# Patient Record
Sex: Female | Born: 1970 | Race: White | Hispanic: No | Marital: Single | Smoking: Current every day smoker
Health system: Southern US, Community
[De-identification: ages and names within clinical notes are randomized; demographics above are authoritative.]

## PROBLEM LIST (undated history)

## (undated) DIAGNOSIS — F4310 Post-traumatic stress disorder, unspecified: Secondary | ICD-10-CM

## (undated) DIAGNOSIS — F329 Major depressive disorder, single episode, unspecified: Secondary | ICD-10-CM

## (undated) DIAGNOSIS — G43909 Migraine, unspecified, not intractable, without status migrainosus: Secondary | ICD-10-CM

## (undated) DIAGNOSIS — E119 Type 2 diabetes mellitus without complications: Secondary | ICD-10-CM

## (undated) DIAGNOSIS — Z8719 Personal history of other diseases of the digestive system: Secondary | ICD-10-CM

## (undated) DIAGNOSIS — F32A Depression, unspecified: Secondary | ICD-10-CM

## (undated) DIAGNOSIS — G629 Polyneuropathy, unspecified: Secondary | ICD-10-CM

## (undated) DIAGNOSIS — I10 Essential (primary) hypertension: Secondary | ICD-10-CM

## (undated) DIAGNOSIS — C801 Malignant (primary) neoplasm, unspecified: Secondary | ICD-10-CM

## (undated) HISTORY — PX: KNEE ARTHROSCOPY: SUR90

## (undated) HISTORY — DX: Post-traumatic stress disorder, unspecified: F43.10

## (undated) HISTORY — DX: Migraine, unspecified, not intractable, without status migrainosus: G43.909

## (undated) HISTORY — DX: Malignant (primary) neoplasm, unspecified: C80.1

## (undated) HISTORY — DX: Type 2 diabetes mellitus without complications: E11.9

## (undated) HISTORY — PX: APPENDECTOMY: SHX54

---

## 2000-10-27 ENCOUNTER — Emergency Department (HOSPITAL_COMMUNITY): Admission: EM | Admit: 2000-10-27 | Discharge: 2000-10-28 | Payer: Self-pay | Admitting: *Deleted

## 2000-10-28 ENCOUNTER — Encounter: Payer: Self-pay | Admitting: *Deleted

## 2000-10-28 ENCOUNTER — Ambulatory Visit (HOSPITAL_COMMUNITY): Admission: RE | Admit: 2000-10-28 | Discharge: 2000-10-28 | Payer: Self-pay | Admitting: *Deleted

## 2001-07-16 ENCOUNTER — Encounter: Payer: Self-pay | Admitting: *Deleted

## 2001-07-16 ENCOUNTER — Emergency Department (HOSPITAL_COMMUNITY): Admission: EM | Admit: 2001-07-16 | Discharge: 2001-07-16 | Payer: Self-pay | Admitting: *Deleted

## 2013-05-30 ENCOUNTER — Other Ambulatory Visit (HOSPITAL_COMMUNITY): Payer: Self-pay | Admitting: Physician Assistant

## 2013-05-30 DIAGNOSIS — Z139 Encounter for screening, unspecified: Secondary | ICD-10-CM

## 2013-06-13 ENCOUNTER — Ambulatory Visit (HOSPITAL_COMMUNITY)
Admission: RE | Admit: 2013-06-13 | Discharge: 2013-06-13 | Disposition: A | Discharge status: Home / Self Care | Payer: Self-pay | Source: Ambulatory Visit | Attending: Physician Assistant | Admitting: Physician Assistant

## 2013-06-13 DIAGNOSIS — Z139 Encounter for screening, unspecified: Secondary | ICD-10-CM

## 2013-06-15 ENCOUNTER — Other Ambulatory Visit: Payer: Self-pay | Admitting: Physician Assistant

## 2013-06-15 DIAGNOSIS — R928 Other abnormal and inconclusive findings on diagnostic imaging of breast: Secondary | ICD-10-CM

## 2013-07-24 ENCOUNTER — Other Ambulatory Visit (HOSPITAL_COMMUNITY): Payer: Self-pay | Admitting: Physician Assistant

## 2013-07-24 ENCOUNTER — Other Ambulatory Visit (HOSPITAL_COMMUNITY): Payer: Self-pay | Admitting: *Deleted

## 2013-07-24 DIAGNOSIS — R928 Other abnormal and inconclusive findings on diagnostic imaging of breast: Secondary | ICD-10-CM

## 2013-07-26 ENCOUNTER — Inpatient Hospital Stay (HOSPITAL_COMMUNITY): Admission: RE | Admit: 2013-07-26 | Payer: Self-pay | Source: Ambulatory Visit

## 2013-08-02 ENCOUNTER — Other Ambulatory Visit (HOSPITAL_COMMUNITY): Payer: Self-pay | Admitting: *Deleted

## 2013-08-02 ENCOUNTER — Ambulatory Visit (HOSPITAL_COMMUNITY)
Admission: RE | Admit: 2013-08-02 | Discharge: 2013-08-02 | Disposition: A | Discharge status: Home / Self Care | Payer: PRIVATE HEALTH INSURANCE | Source: Ambulatory Visit | Attending: *Deleted | Admitting: *Deleted

## 2013-08-02 DIAGNOSIS — R928 Other abnormal and inconclusive findings on diagnostic imaging of breast: Secondary | ICD-10-CM

## 2013-08-02 DIAGNOSIS — Z1231 Encounter for screening mammogram for malignant neoplasm of breast: Secondary | ICD-10-CM | POA: Insufficient documentation

## 2013-12-26 ENCOUNTER — Encounter (HOSPITAL_BASED_OUTPATIENT_CLINIC_OR_DEPARTMENT_OTHER): Payer: Self-pay | Attending: General Surgery

## 2013-12-26 ENCOUNTER — Other Ambulatory Visit: Payer: Self-pay

## 2013-12-26 DIAGNOSIS — F17200 Nicotine dependence, unspecified, uncomplicated: Secondary | ICD-10-CM | POA: Insufficient documentation

## 2013-12-26 DIAGNOSIS — L989 Disorder of the skin and subcutaneous tissue, unspecified: Secondary | ICD-10-CM | POA: Insufficient documentation

## 2013-12-26 DIAGNOSIS — Z9089 Acquired absence of other organs: Secondary | ICD-10-CM | POA: Insufficient documentation

## 2013-12-26 NOTE — H&P (Signed)
Jasmine Morrison, Jasmine Morrison               ACCOUNT NO.:  [id number]  MEDICAL RECORD NO.:  [id number]  LOCATION:  FOOT                         FACILITY:  [facility]  PHYSICIAN:  Elesa Hacker, M.D.        DATE OF BIRTH:  May 02, 1971  DATE OF ADMISSION:  12/26/2013 DATE OF DISCHARGE:                             HISTORY & PHYSICAL   CHIEF COMPLAINT:  Sore, right third toe.  HISTORY OF PRESENT ILLNESS:  This is a 43 year old female, nondiabetic, has had a growth on her toe which occasionally she self treats, but it always returns, it is tender and painful.  PAST MEDICAL HISTORY:  Reasonably negative.  She has questionably type 2 diabetes, although she said that some doctor say she does not have diabetes.  PAST SURGICAL HISTORY:  Appendectomy, C-section.  SOCIAL HISTORY:  Cigarettes smokes a pack or so a day.  Alcohol occasionally.  MEDICATIONS:  Cymbalta and trazodone.  ALLERGIES:  None.  REVIEW OF SYSTEMS:  As above.  PHYSICAL EXAMINATION:  VITAL SIGNS:  Temperature 98.4, pulse 71, respirations 18, blood pressure 113/72. GENERAL APPEARANCE:  Well developed, well nourished. CHEST:  Clear. HEART:  Regular rhythm. EXTREMITIES:  Examination of the lower extremities reveals good peripheral pulses.  There is a growth at the tip of the third toe, which has the appearance of a bony excrescence approximately 1 cm long and 0.5 cm at the base.  IMPRESSION:  Skin lesion of unknown cause.  TREATMENT:  The toe was blocked with 1% Xylocaine.  The wound was completely excised.  Hemostasis is easily obtained.  The specimen was sent to pathology.  We will see the patient in 7 days.     Elesa Hacker, M.D.     RA/MEDQ  D:  12/26/2013  T:  12/26/2013  Job:  [id number]

## 2014-01-02 ENCOUNTER — Ambulatory Visit (HOSPITAL_COMMUNITY)
Admission: RE | Admit: 2014-01-02 | Discharge: 2014-01-02 | Disposition: A | Discharge status: Home / Self Care | Payer: Self-pay | Source: Ambulatory Visit | Attending: General Surgery | Admitting: General Surgery

## 2014-01-02 ENCOUNTER — Other Ambulatory Visit (HOSPITAL_COMMUNITY): Payer: Self-pay | Admitting: General Surgery

## 2014-01-02 DIAGNOSIS — M869 Osteomyelitis, unspecified: Secondary | ICD-10-CM

## 2014-01-02 DIAGNOSIS — M899 Disorder of bone, unspecified: Secondary | ICD-10-CM | POA: Insufficient documentation

## 2014-01-02 DIAGNOSIS — S91109A Unspecified open wound of unspecified toe(s) without damage to nail, initial encounter: Secondary | ICD-10-CM | POA: Insufficient documentation

## 2014-01-02 DIAGNOSIS — R52 Pain, unspecified: Secondary | ICD-10-CM

## 2014-01-02 DIAGNOSIS — X58XXXA Exposure to other specified factors, initial encounter: Secondary | ICD-10-CM | POA: Insufficient documentation

## 2014-01-02 DIAGNOSIS — M949 Disorder of cartilage, unspecified: Secondary | ICD-10-CM

## 2014-01-08 ENCOUNTER — Other Ambulatory Visit (HOSPITAL_COMMUNITY): Payer: Self-pay

## 2014-01-08 ENCOUNTER — Ambulatory Visit (HOSPITAL_COMMUNITY)
Admission: RE | Admit: 2014-01-08 | Discharge: 2014-01-08 | Disposition: A | Discharge status: Home / Self Care | Payer: Self-pay | Source: Ambulatory Visit | Attending: General Surgery | Admitting: General Surgery

## 2014-01-08 DIAGNOSIS — M6250 Muscle wasting and atrophy, not elsewhere classified, unspecified site: Secondary | ICD-10-CM | POA: Insufficient documentation

## 2014-01-08 DIAGNOSIS — M869 Osteomyelitis, unspecified: Secondary | ICD-10-CM | POA: Insufficient documentation

## 2014-01-08 DIAGNOSIS — M79609 Pain in unspecified limb: Secondary | ICD-10-CM | POA: Insufficient documentation

## 2014-01-08 DIAGNOSIS — R52 Pain, unspecified: Secondary | ICD-10-CM

## 2014-01-08 LAB — POCT I-STAT CREATININE: Creatinine, Ser: 0.9 mg/dL (ref 0.50–1.10)

## 2014-01-08 MED ORDER — GADOBENATE DIMEGLUMINE 529 MG/ML IV SOLN
20.0000 mL | Freq: Once | INTRAVENOUS | Status: AC | PRN
  Administered 2014-01-08: 10 mL via INTRAVENOUS

## 2014-01-11 ENCOUNTER — Other Ambulatory Visit (HOSPITAL_COMMUNITY): Payer: Self-pay | Admitting: *Deleted

## 2014-01-11 DIAGNOSIS — N6002 Solitary cyst of left breast: Secondary | ICD-10-CM

## 2014-01-16 ENCOUNTER — Encounter (HOSPITAL_BASED_OUTPATIENT_CLINIC_OR_DEPARTMENT_OTHER): Payer: Self-pay | Attending: General Surgery

## 2014-01-16 DIAGNOSIS — L851 Acquired keratosis [keratoderma] palmaris et plantaris: Secondary | ICD-10-CM | POA: Insufficient documentation

## 2014-01-16 DIAGNOSIS — L84 Corns and callosities: Secondary | ICD-10-CM | POA: Insufficient documentation

## 2014-02-06 ENCOUNTER — Ambulatory Visit (HOSPITAL_COMMUNITY)
Admission: RE | Admit: 2014-02-06 | Discharge: 2014-02-06 | Disposition: A | Discharge status: Home / Self Care | Payer: PRIVATE HEALTH INSURANCE | Source: Ambulatory Visit | Attending: *Deleted | Admitting: *Deleted

## 2014-02-06 DIAGNOSIS — N6002 Solitary cyst of left breast: Secondary | ICD-10-CM

## 2014-02-06 DIAGNOSIS — Z09 Encounter for follow-up examination after completed treatment for conditions other than malignant neoplasm: Secondary | ICD-10-CM | POA: Insufficient documentation

## 2014-02-06 DIAGNOSIS — N6009 Solitary cyst of unspecified breast: Secondary | ICD-10-CM | POA: Insufficient documentation

## 2014-02-14 ENCOUNTER — Encounter (HOSPITAL_BASED_OUTPATIENT_CLINIC_OR_DEPARTMENT_OTHER): Payer: PRIVATE HEALTH INSURANCE | Attending: General Surgery

## 2014-02-14 DIAGNOSIS — L851 Acquired keratosis [keratoderma] palmaris et plantaris: Secondary | ICD-10-CM | POA: Insufficient documentation

## 2014-06-13 ENCOUNTER — Other Ambulatory Visit (HOSPITAL_COMMUNITY): Payer: Self-pay | Admitting: Physician Assistant

## 2014-06-13 DIAGNOSIS — R51 Headache: Principal | ICD-10-CM

## 2014-06-13 DIAGNOSIS — R519 Headache, unspecified: Secondary | ICD-10-CM

## 2014-06-18 ENCOUNTER — Ambulatory Visit (HOSPITAL_COMMUNITY)
Admission: RE | Admit: 2014-06-18 | Discharge: 2014-06-18 | Disposition: A | Discharge status: Home / Self Care | Payer: Self-pay | Source: Ambulatory Visit | Attending: Physician Assistant | Admitting: Physician Assistant

## 2014-06-18 DIAGNOSIS — R51 Headache: Secondary | ICD-10-CM | POA: Insufficient documentation

## 2014-06-18 DIAGNOSIS — R519 Headache, unspecified: Secondary | ICD-10-CM

## 2014-09-11 ENCOUNTER — Other Ambulatory Visit (HOSPITAL_COMMUNITY): Payer: Self-pay | Admitting: *Deleted

## 2014-09-11 DIAGNOSIS — Z09 Encounter for follow-up examination after completed treatment for conditions other than malignant neoplasm: Secondary | ICD-10-CM

## 2014-09-11 DIAGNOSIS — C801 Malignant (primary) neoplasm, unspecified: Secondary | ICD-10-CM

## 2014-09-11 HISTORY — DX: Malignant (primary) neoplasm, unspecified: C80.1

## 2014-09-18 ENCOUNTER — Other Ambulatory Visit (HOSPITAL_COMMUNITY): Payer: Self-pay | Admitting: *Deleted

## 2014-09-18 ENCOUNTER — Ambulatory Visit (HOSPITAL_COMMUNITY)
Admission: RE | Admit: 2014-09-18 | Discharge: 2014-09-18 | Disposition: A | Discharge status: Home / Self Care | Payer: PRIVATE HEALTH INSURANCE | Source: Ambulatory Visit | Attending: *Deleted | Admitting: *Deleted

## 2014-09-18 DIAGNOSIS — N6011 Diffuse cystic mastopathy of right breast: Secondary | ICD-10-CM | POA: Diagnosis not present

## 2014-09-18 DIAGNOSIS — Z09 Encounter for follow-up examination after completed treatment for conditions other than malignant neoplasm: Secondary | ICD-10-CM

## 2014-09-18 DIAGNOSIS — N6012 Diffuse cystic mastopathy of left breast: Secondary | ICD-10-CM | POA: Insufficient documentation

## 2014-09-26 ENCOUNTER — Other Ambulatory Visit: Payer: Self-pay | Admitting: Obstetrics and Gynecology

## 2014-09-26 ENCOUNTER — Ambulatory Visit (INDEPENDENT_AMBULATORY_CARE_PROVIDER_SITE_OTHER): Payer: Medicaid Other | Admitting: Obstetrics and Gynecology

## 2014-09-26 ENCOUNTER — Encounter: Payer: Self-pay | Admitting: Obstetrics and Gynecology

## 2014-09-26 VITALS — BP 110/60 | Ht 63.0 in | Wt 245.0 lb

## 2014-09-26 DIAGNOSIS — N871 Moderate cervical dysplasia: Secondary | ICD-10-CM

## 2014-09-26 DIAGNOSIS — R87618 Other abnormal cytological findings on specimens from cervix uteri: Secondary | ICD-10-CM

## 2014-09-26 HISTORY — PX: COLPOSCOPY: SHX161

## 2014-09-26 NOTE — Patient Instructions (Signed)
RESULTS OF BIOPSY WILL BE BACK IN 1 WK Colposcopy Care After Colposcopy is a procedure in which a special tool is used to magnify the surface of the cervix. A tissue sample (biopsy) may also be taken. This sample will be looked at for cervical cancer or other problems. After the test:  You may have some cramping.  Lie down for a few minutes if you feel lightheaded.   You may have some bleeding which should stop in a few days. HOME CARE  Do not have sex or use tampons for 2 to 3 days or as told.  Only take medicine as told by your doctor.  Continue to take your birth control pills as usual. Finding out the results of your test Ask when your test results will be ready. Make sure you get your test results. GET HELP RIGHT AWAY IF:  You are bleeding a lot or are passing blood clots.  You develop a fever of 102 F (38.9 C) or higher.  You have abnormal vaginal discharge.  You have cramps that do not go away with medicine.  You feel lightheaded, dizzy, or pass out (faint). MAKE SURE YOU:   Understand these instructions.  Will watch your condition.  Will get help right away if you are not doing well or get worse. Document Released: 12/16/2007 Document Revised: 09/21/2011 Document Reviewed: 01/26/2013 [facility] Patient Information 2015 [place], [place]. This information is not intended to replace advice given to you by your health care provider. Make sure you discuss any questions you have with your health care provider.
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
## 2014-09-26 NOTE — Progress Notes (Signed)
Patient ID: Jasmine Morrison, female   DOB: 19-Jun-1971, 44 y.o.   MRN: [id number] Pt here today for referral from [facility]. Pt states that she had not had a pap smear since 1993, and has never had, to her knowledge, an abnormal pap smear. Pt states that she started having problems with her breasts, while she was there they talked her into doing a pap smear since it had been so long since her last one. The pt stated that the next week they called her back and made a follow up appointment where they told her that she has cancer. 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 Pt would like to discuss all of this with Dr. Glo Herring and then do what ever needs to be done.

## 2014-09-29 ENCOUNTER — Emergency Department (HOSPITAL_COMMUNITY)
Admission: EM | Admit: 2014-09-29 | Discharge: 2014-09-29 | Disposition: A | Discharge status: Home / Self Care | Payer: Medicaid Other | Attending: Emergency Medicine | Admitting: Emergency Medicine

## 2014-09-29 ENCOUNTER — Encounter (HOSPITAL_COMMUNITY): Payer: Self-pay | Admitting: Emergency Medicine

## 2014-09-29 DIAGNOSIS — Z3202 Encounter for pregnancy test, result negative: Secondary | ICD-10-CM | POA: Diagnosis not present

## 2014-09-29 DIAGNOSIS — R1084 Generalized abdominal pain: Secondary | ICD-10-CM | POA: Diagnosis present

## 2014-09-29 DIAGNOSIS — E119 Type 2 diabetes mellitus without complications: Secondary | ICD-10-CM | POA: Diagnosis not present

## 2014-09-29 DIAGNOSIS — Z8659 Personal history of other mental and behavioral disorders: Secondary | ICD-10-CM | POA: Insufficient documentation

## 2014-09-29 DIAGNOSIS — Z720 Tobacco use: Secondary | ICD-10-CM | POA: Diagnosis not present

## 2014-09-29 DIAGNOSIS — Z9889 Other specified postprocedural states: Secondary | ICD-10-CM

## 2014-09-29 DIAGNOSIS — N939 Abnormal uterine and vaginal bleeding, unspecified: Secondary | ICD-10-CM | POA: Diagnosis not present

## 2014-09-29 DIAGNOSIS — Z791 Long term (current) use of non-steroidal anti-inflammatories (NSAID): Secondary | ICD-10-CM | POA: Diagnosis not present

## 2014-09-29 DIAGNOSIS — Z8679 Personal history of other diseases of the circulatory system: Secondary | ICD-10-CM | POA: Insufficient documentation

## 2014-09-29 LAB — URINALYSIS, ROUTINE W REFLEX MICROSCOPIC
BILIRUBIN URINE: NEGATIVE
Glucose, UA: NEGATIVE mg/dL
KETONES UR: NEGATIVE mg/dL
Leukocytes, UA: NEGATIVE
Nitrite: NEGATIVE
PH: 6 (ref 5.0–8.0)
Protein, ur: NEGATIVE mg/dL
Specific Gravity, Urine: 1.01 (ref 1.005–1.030)
UROBILINOGEN UA: 0.2 mg/dL (ref 0.0–1.0)

## 2014-09-29 LAB — URINE MICROSCOPIC-ADD ON

## 2014-09-29 LAB — I-STAT CHEM 8, ED
BUN: 7 mg/dL (ref 6–23)
CALCIUM ION: 1.18 mmol/L (ref 1.12–1.23)
CHLORIDE: 104 mmol/L (ref 96–112)
CREATININE: 0.7 mg/dL (ref 0.50–1.10)
Glucose, Bld: 119 mg/dL — ABNORMAL HIGH (ref 70–99)
HCT: 47 % — ABNORMAL HIGH (ref 36.0–46.0)
Hemoglobin: 16 g/dL — ABNORMAL HIGH (ref 12.0–15.0)
POTASSIUM: 4.4 mmol/L (ref 3.5–5.1)
Sodium: 140 mmol/L (ref 135–145)
TCO2: 22 mmol/L (ref 0–100)

## 2014-09-29 LAB — PREGNANCY, URINE: PREG TEST UR: NEGATIVE

## 2014-09-29 MED ORDER — NAPROXEN SODIUM 550 MG PO TABS: 550.0000 mg | ORAL_TABLET | Freq: Two times a day (BID) | ORAL | Status: DC

## 2014-09-29 NOTE — Discharge Instructions (Signed)
Your bleeding today is normal after the procedure that you had. Stop the aleve and take the prescription medication we give you today. Call Dr. Glo Herring for a follow up appointment.

## 2014-09-29 NOTE — ED Notes (Signed)
Pelvic cart set up 

## 2014-09-29 NOTE — ED Notes (Signed)
Colposcopy done this past Wednesday, has had cramping ever since.
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
## 2014-10-01 ENCOUNTER — Telehealth: Payer: Self-pay | Admitting: Obstetrics and Gynecology

## 2014-10-01 NOTE — Telephone Encounter (Signed)
Pt aware of path result. Will see for appt Wed 3/23 for deciding on further surgery, CKC recommended unless other problems exist.

## 2014-10-03 ENCOUNTER — Ambulatory Visit (INDEPENDENT_AMBULATORY_CARE_PROVIDER_SITE_OTHER): Payer: Medicaid Other | Admitting: Obstetrics and Gynecology

## 2014-10-03 ENCOUNTER — Encounter: Payer: Self-pay | Admitting: Obstetrics and Gynecology

## 2014-10-03 VITALS — BP 100/60 | Ht 63.0 in | Wt 244.0 lb

## 2014-10-03 DIAGNOSIS — R87613 High grade squamous intraepithelial lesion on cytologic smear of cervix (HGSIL): Secondary | ICD-10-CM | POA: Diagnosis not present

## 2014-10-03 DIAGNOSIS — N871 Moderate cervical dysplasia: Secondary | ICD-10-CM | POA: Diagnosis not present

## 2014-10-03 NOTE — Progress Notes (Signed)
Patient ID: Jasmine Morrison, female   DOB: [birth year], 44 y.o.   MRN: [id number] Pt here today for results.
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
## 2014-10-25 NOTE — Patient Instructions (Signed)
Jasmine Morrison  10/25/2014   Your procedure is scheduled on:  10/30/2014  Report to [facility] at 6:15 AM.  Call this number if you have problems the morning of surgery: [phone]   Remember:   Do not eat food or drink liquids after midnight.   Take these medicines the morning of surgery with A SIP OF WATER: Buspar, Cymbalta, Gabapentin, Lisinopril, Inderal   Do not wear jewelry, make-up or nail polish.  Do not wear lotions, powders, or perfumes. You may wear deodorant.  Do not shave 48 hours prior to surgery. Men may shave face and neck.  Do not bring valuables to the hospital.  [facility] is not responsible for any belongings or valuables.               Contacts, dentures or bridgework may not be worn into surgery.  Leave suitcase in the car. After surgery it may be brought to your room.  For patients admitted to the hospital, discharge time is determined by your                treatment team.               Patients discharged the day of surgery will not be allowed to drive home.  Name and phone number of your driver:   Special Instructions: Shower using CHG 2 nights before surgery and the night before surgery.  If you shower the day of surgery use CHG.  Use special wash - you have one bottle of CHG for all showers.  You should use approximately 1/3 of the bottle for each shower.   Please read over the following fact sheets that you were given: Surgical Site Infection Prevention and Anesthesia Post-op Instructions   PATIENT INSTRUCTIONS POST-ANESTHESIA  IMMEDIATELY FOLLOWING SURGERY:  Do not drive or operate machinery for the first twenty four hours after surgery.  Do not make any important decisions for twenty four hours after surgery or while taking narcotic pain medications or sedatives.  If you develop intractable nausea and vomiting or a severe headache please notify your doctor immediately.  FOLLOW-UP:  Please make an appointment with your surgeon as instructed. You do not  need to follow up with anesthesia unless specifically instructed to do so.  WOUND CARE INSTRUCTIONS (if applicable):  Keep a dry clean dressing on the anesthesia/puncture wound site if there is drainage.  Once the wound has quit draining you may leave it open to air.  Generally you should leave the bandage intact for twenty four hours unless there is drainage.  If the epidural site drains for more than 36-48 hours please call the anesthesia department.  QUESTIONS?:  Please feel free to call your physician or the hospital operator if you have any questions, and they will be happy to assist you.      Conization of the Cervix Cervical conization is the cutting (excision) of a cone-shaped portion of the cervix. The procedure is performed through the vagina in either your health care provider's office or an operating room. This procedure is usually done when there is abnormal bleeding from the cervix. It can also be done to evaluate an abnormal Pap test or if an abnormality is seen on the cervix during an exam. The tissue is then examined to see if there are precancerous cells or cancer present.  Conization of the cervix is not done during a menstrual period or pregnancy.  LET [facility] CARE PROVIDER KNOW ABOUT:  Any  allergies you have.   All medicines you are taking, including vitamins, herbs, eye drops, creams, and over-the-counter medicines.   Previous problems you or members of your family have had with the use of anesthetics.   Any blood disorders you have.   Previous surgeries you have had.   Medical conditions you have.   Your smoking habits.   The possibility of being pregnant.  RISKS AND COMPLICATIONS  Generally, conization of the cervix is a safe procedure. However, as with any procedure, complications can occur. Possible complications include:  Heavy bleeding several days or weeks after the procedure. Light bleeding or spotting after the procedure is normal.  Infection  (rare).  Damage to the cervix or surrounding organs (uncommon).   Problems with the anesthesia.   Increased risk of preterm labor in future pregnancies. BEFORE THE PROCEDURE  Do not eat or drink anything for 6-8 hours before the procedure.   Do not take aspirin or blood thinners for at least a week before the procedure or as directed by your health care provider.   Arrange for someone to take you home after the procedure.  PROCEDURE There are three different methods to perform conization of the cervix. These include:   The cold knife method. In this method a small cone-shaped sample of tissue is cut out with a knife (scalpel) from the cervical canal and the transformation zone (where the normal cells end and the abnormal cells begin).   The LEEP method. In this method a small cone-shaped sample of tissue is cut out with a thin wire that can burn (cauterize) the cervical tissue with an electrical current.   Laser treatment. In this method a small cone-shaped sample of tissue is cut out and then cauterized with a laser beam to prevent bleeding.  The procedure will be performed as follows:   Depending on the method, you will either be given a medicine to make you sleep (general anesthetic) or a numbing medicine (local anesthetic). A medicine that numbs the cervix (cervical block) may be given.   A lubricated device called a speculum will be inserted into the vagina to spread open the walls of the vagina. This will help your health care provider see the inside of the vagina and cervix better.   The tissue from the cervix will be removed and examined.   The results of the procedure will help your health care provider decide if further treatment is necessary. They will also help your health care provider decide on the best treatment if your results are abnormal. AFTER THE PROCEDURE  If you had a general anesthetic, you may be groggy for 2-3 hours after the procedure.   If you  had a local anesthetic, you will rest at the clinic or hospital until you are stable and feel ready to go home.   Recovery may take up to 3 weeks.   You may have some cramping for about 1 week.   You may have bloody discharge or light bleeding for 1-2 weeks.   You may have black discharge coming from the vagina. This is from the paste used on the cervix to prevent bleeding. This is normal discharge.  Document Released: 04/08/2005 Document Revised: 07/04/2013 Document Reviewed: 12/23/2012 [facility] Patient Information 2015 [place], [place]. This information is not intended to replace advice given to you by your health care provider. Make sure you discuss any questions you have with your health care provider.

## 2014-10-26 ENCOUNTER — Other Ambulatory Visit: Payer: Self-pay

## 2014-10-26 ENCOUNTER — Encounter (HOSPITAL_COMMUNITY)
Admission: RE | Admit: 2014-10-26 | Discharge: 2014-10-26 | Disposition: A | Discharge status: Home / Self Care | Payer: Medicaid Other | Source: Ambulatory Visit | Attending: Obstetrics and Gynecology | Admitting: Obstetrics and Gynecology

## 2014-10-26 ENCOUNTER — Encounter (HOSPITAL_COMMUNITY): Payer: Self-pay

## 2014-10-26 ENCOUNTER — Other Ambulatory Visit: Payer: Self-pay | Admitting: Obstetrics and Gynecology

## 2014-10-26 DIAGNOSIS — N871 Moderate cervical dysplasia: Secondary | ICD-10-CM | POA: Diagnosis not present

## 2014-10-26 DIAGNOSIS — Z01810 Encounter for preprocedural cardiovascular examination: Secondary | ICD-10-CM | POA: Diagnosis not present

## 2014-10-26 DIAGNOSIS — Z01812 Encounter for preprocedural laboratory examination: Secondary | ICD-10-CM | POA: Diagnosis not present

## 2014-10-26 HISTORY — DX: Major depressive disorder, single episode, unspecified: F32.9

## 2014-10-26 HISTORY — DX: Depression, unspecified: F32.A

## 2014-10-26 HISTORY — DX: Polyneuropathy, unspecified: G62.9

## 2014-10-26 LAB — COMPREHENSIVE METABOLIC PANEL
ALK PHOS: 68 U/L (ref 39–117)
ALT: 18 U/L (ref 0–35)
AST: 19 U/L (ref 0–37)
Albumin: 4 g/dL (ref 3.5–5.2)
Anion gap: 8 (ref 5–15)
BILIRUBIN TOTAL: 0.5 mg/dL (ref 0.3–1.2)
BUN: 15 mg/dL (ref 6–23)
CHLORIDE: 103 mmol/L (ref 96–112)
CO2: 25 mmol/L (ref 19–32)
Calcium: 9.4 mg/dL (ref 8.4–10.5)
Creatinine, Ser: 0.77 mg/dL (ref 0.50–1.10)
GLUCOSE: 111 mg/dL — AB (ref 70–99)
POTASSIUM: 4.4 mmol/L (ref 3.5–5.1)
SODIUM: 136 mmol/L (ref 135–145)
Total Protein: 6.9 g/dL (ref 6.0–8.3)

## 2014-10-26 LAB — URINALYSIS, ROUTINE W REFLEX MICROSCOPIC
BILIRUBIN URINE: NEGATIVE
GLUCOSE, UA: NEGATIVE mg/dL
KETONES UR: NEGATIVE mg/dL
LEUKOCYTES UA: NEGATIVE
NITRITE: NEGATIVE
Protein, ur: NEGATIVE mg/dL
SPECIFIC GRAVITY, URINE: 1.015 (ref 1.005–1.030)
Urobilinogen, UA: 0.2 mg/dL (ref 0.0–1.0)
pH: 5.5 (ref 5.0–8.0)

## 2014-10-26 LAB — CBC
HEMATOCRIT: 42.8 % (ref 36.0–46.0)
Hemoglobin: 14.6 g/dL (ref 12.0–15.0)
MCH: 30.7 pg (ref 26.0–34.0)
MCHC: 34.1 g/dL (ref 30.0–36.0)
MCV: 89.9 fL (ref 78.0–100.0)
PLATELETS: 185 10*3/uL (ref 150–400)
RBC: 4.76 MIL/uL (ref 3.87–5.11)
RDW: 13.4 % (ref 11.5–15.5)
WBC: 11.7 10*3/uL — AB (ref 4.0–10.5)

## 2014-10-26 LAB — URINE MICROSCOPIC-ADD ON

## 2014-10-26 LAB — HCG, SERUM, QUALITATIVE: PREG SERUM: NEGATIVE

## 2014-10-26 NOTE — Pre-Procedure Instructions (Signed)
Patient given information to sign up for my chart at home. 

## 2014-10-30 ENCOUNTER — Encounter (HOSPITAL_COMMUNITY)
Admission: RE | Disposition: A | Discharge status: Home / Self Care | Payer: Self-pay | Source: Ambulatory Visit | Attending: Obstetrics and Gynecology

## 2014-10-30 ENCOUNTER — Encounter (HOSPITAL_COMMUNITY): Payer: Self-pay

## 2014-10-30 ENCOUNTER — Ambulatory Visit (HOSPITAL_COMMUNITY)
Admission: RE | Admit: 2014-10-30 | Discharge: 2014-10-30 | Disposition: A | Discharge status: Home / Self Care | Payer: Medicaid Other | Source: Ambulatory Visit | Attending: Obstetrics and Gynecology | Admitting: Obstetrics and Gynecology

## 2014-10-30 ENCOUNTER — Ambulatory Visit (HOSPITAL_COMMUNITY): Payer: Medicaid Other | Admitting: Anesthesiology

## 2014-10-30 DIAGNOSIS — Z791 Long term (current) use of non-steroidal anti-inflammatories (NSAID): Secondary | ICD-10-CM | POA: Insufficient documentation

## 2014-10-30 DIAGNOSIS — Z859 Personal history of malignant neoplasm, unspecified: Secondary | ICD-10-CM | POA: Diagnosis not present

## 2014-10-30 DIAGNOSIS — G43909 Migraine, unspecified, not intractable, without status migrainosus: Secondary | ICD-10-CM | POA: Insufficient documentation

## 2014-10-30 DIAGNOSIS — Z6841 Body Mass Index (BMI) 40.0 and over, adult: Secondary | ICD-10-CM | POA: Insufficient documentation

## 2014-10-30 DIAGNOSIS — D069 Carcinoma in situ of cervix, unspecified: Secondary | ICD-10-CM | POA: Diagnosis not present

## 2014-10-30 DIAGNOSIS — E119 Type 2 diabetes mellitus without complications: Secondary | ICD-10-CM | POA: Insufficient documentation

## 2014-10-30 DIAGNOSIS — N871 Moderate cervical dysplasia: Secondary | ICD-10-CM | POA: Diagnosis present

## 2014-10-30 DIAGNOSIS — F4310 Post-traumatic stress disorder, unspecified: Secondary | ICD-10-CM | POA: Diagnosis not present

## 2014-10-30 DIAGNOSIS — F17210 Nicotine dependence, cigarettes, uncomplicated: Secondary | ICD-10-CM | POA: Insufficient documentation

## 2014-10-30 DIAGNOSIS — Z79899 Other long term (current) drug therapy: Secondary | ICD-10-CM | POA: Diagnosis not present

## 2014-10-30 DIAGNOSIS — F329 Major depressive disorder, single episode, unspecified: Secondary | ICD-10-CM | POA: Insufficient documentation

## 2014-10-30 DIAGNOSIS — K219 Gastro-esophageal reflux disease without esophagitis: Secondary | ICD-10-CM | POA: Diagnosis not present

## 2014-10-30 DIAGNOSIS — N870 Mild cervical dysplasia: Secondary | ICD-10-CM | POA: Diagnosis not present

## 2014-10-30 HISTORY — PX: CERVICAL CONIZATION W/BX: SHX1330

## 2014-10-30 LAB — GLUCOSE, CAPILLARY
GLUCOSE-CAPILLARY: 124 mg/dL — AB (ref 70–99)
Glucose-Capillary: 143 mg/dL — ABNORMAL HIGH (ref 70–99)

## 2014-10-30 SURGERY — CONE BIOPSY, CERVIX
Anesthesia: General | Site: Cervix

## 2014-10-30 MED ORDER — IBUPROFEN 600 MG PO TABS: 600.0000 mg | ORAL_TABLET | Freq: Four times a day (QID) | ORAL | Status: DC | PRN

## 2014-10-30 MED ORDER — HEMOSTATIC AGENTS (NO CHARGE) OPTIME
TOPICAL | Status: DC | PRN
  Administered 2014-10-30: 1 via TOPICAL

## 2014-10-30 MED ORDER — BUPIVACAINE-EPINEPHRINE 0.5% -1:200000 IJ SOLN
INTRAMUSCULAR | Status: DC | PRN
  Administered 2014-10-30: 5 mL

## 2014-10-30 MED ORDER — PROPOFOL 10 MG/ML IV BOLUS
INTRAVENOUS | Status: DC | PRN
  Administered 2014-10-30: 180 mg via INTRAVENOUS

## 2014-10-30 MED ORDER — ALBUTEROL SULFATE HFA 108 (90 BASE) MCG/ACT IN AERS
INHALATION_SPRAY | RESPIRATORY_TRACT | Status: DC | PRN
  Administered 2014-10-30 (×2): 3 via RESPIRATORY_TRACT

## 2014-10-30 MED ORDER — PROPOFOL 10 MG/ML IV BOLUS
INTRAVENOUS | Status: AC
  Filled 2014-10-30: qty 20

## 2014-10-30 MED ORDER — NEOSTIGMINE METHYLSULFATE 10 MG/10ML IV SOLN
INTRAVENOUS | Status: AC
  Filled 2014-10-30: qty 1

## 2014-10-30 MED ORDER — ROCURONIUM BROMIDE 100 MG/10ML IV SOLN
INTRAVENOUS | Status: DC | PRN
  Administered 2014-10-30: 5 mg via INTRAVENOUS
  Administered 2014-10-30: 35 mg via INTRAVENOUS

## 2014-10-30 MED ORDER — BUPIVACAINE-EPINEPHRINE (PF) 0.5% -1:200000 IJ SOLN
INTRAMUSCULAR | Status: AC
  Filled 2014-10-30: qty 30

## 2014-10-30 MED ORDER — ALBUTEROL SULFATE HFA 108 (90 BASE) MCG/ACT IN AERS
INHALATION_SPRAY | RESPIRATORY_TRACT | Status: AC
  Filled 2014-10-30: qty 6.7

## 2014-10-30 MED ORDER — FENTANYL CITRATE (PF) 100 MCG/2ML IJ SOLN
INTRAMUSCULAR | Status: DC | PRN
  Administered 2014-10-30: 100 ug via INTRAVENOUS

## 2014-10-30 MED ORDER — ONDANSETRON HCL 4 MG/2ML IJ SOLN
4.0000 mg | Freq: Once | INTRAMUSCULAR | Status: AC
  Administered 2014-10-30: 4 mg via INTRAVENOUS

## 2014-10-30 MED ORDER — LIDOCAINE HCL (CARDIAC) 10 MG/ML IV SOLN
INTRAVENOUS | Status: DC | PRN
  Administered 2014-10-30: 50 mg via INTRAVENOUS

## 2014-10-30 MED ORDER — MEPERIDINE HCL 50 MG/ML IJ SOLN: 6.2500 mg | Freq: Once | INTRAMUSCULAR | Status: DC

## 2014-10-30 MED ORDER — SUCCINYLCHOLINE CHLORIDE 20 MG/ML IJ SOLN
INTRAMUSCULAR | Status: AC
  Filled 2014-10-30: qty 1

## 2014-10-30 MED ORDER — MIDAZOLAM HCL 2 MG/2ML IJ SOLN
INTRAMUSCULAR | Status: AC
  Filled 2014-10-30: qty 2

## 2014-10-30 MED ORDER — FENTANYL CITRATE (PF) 100 MCG/2ML IJ SOLN: 25.0000 ug | INTRAMUSCULAR | Status: DC | PRN

## 2014-10-30 MED ORDER — LIDOCAINE HCL (PF) 1 % IJ SOLN
INTRAMUSCULAR | Status: AC
  Filled 2014-10-30: qty 5

## 2014-10-30 MED ORDER — HYDROCODONE-ACETAMINOPHEN 5-325 MG PO TABS: 1.0000 | ORAL_TABLET | Freq: Four times a day (QID) | ORAL | Status: DC | PRN

## 2014-10-30 MED ORDER — ONDANSETRON HCL 4 MG/2ML IJ SOLN: 4.0000 mg | Freq: Once | INTRAMUSCULAR | Status: DC | PRN

## 2014-10-30 MED ORDER — FERRIC SUBSULFATE 259 MG/GM EX SOLN
CUTANEOUS | Status: DC | PRN
  Administered 2014-10-30: 1 via TOPICAL

## 2014-10-30 MED ORDER — ROCURONIUM BROMIDE 50 MG/5ML IV SOLN
INTRAVENOUS | Status: AC
  Filled 2014-10-30: qty 1

## 2014-10-30 MED ORDER — ACETIC ACID 5 % SOLN
Status: DC | PRN
  Administered 2014-10-30: 1 via TOPICAL

## 2014-10-30 MED ORDER — LACTATED RINGERS IV SOLN
INTRAVENOUS | Status: DC
  Administered 2014-10-30: 07:00:00 via INTRAVENOUS

## 2014-10-30 MED ORDER — GLYCOPYRROLATE 0.2 MG/ML IJ SOLN
INTRAMUSCULAR | Status: AC
  Filled 2014-10-30: qty 4

## 2014-10-30 MED ORDER — FENTANYL CITRATE (PF) 100 MCG/2ML IJ SOLN
INTRAMUSCULAR | Status: AC
  Filled 2014-10-30: qty 2

## 2014-10-30 MED ORDER — NEOSTIGMINE METHYLSULFATE 10 MG/10ML IV SOLN
INTRAVENOUS | Status: DC | PRN
  Administered 2014-10-30: 4 mg via INTRAVENOUS

## 2014-10-30 MED ORDER — MIDAZOLAM HCL 2 MG/2ML IJ SOLN
1.0000 mg | INTRAMUSCULAR | Status: DC | PRN
  Administered 2014-10-30: 2 mg via INTRAVENOUS

## 2014-10-30 MED ORDER — FERRIC SUBSULFATE 259 MG/GM EX SOLN
CUTANEOUS | Status: AC
  Filled 2014-10-30: qty 8

## 2014-10-30 MED ORDER — GLYCOPYRROLATE 0.2 MG/ML IJ SOLN
INTRAMUSCULAR | Status: DC | PRN
Start: 2014-10-30 — End: 2014-10-30
  Administered 2014-10-30: .8 mg via INTRAVENOUS

## 2014-10-30 MED ORDER — ONDANSETRON HCL 4 MG/2ML IJ SOLN
INTRAMUSCULAR | Status: AC
  Filled 2014-10-30: qty 2

## 2014-10-30 SURGICAL SUPPLY — 24 items
BAG HAMPER (MISCELLANEOUS) ×3 IMPLANT
BLADE SURG 15 STRL LF DISP TIS (BLADE) ×1 IMPLANT
BLADE SURG 15 STRL SS (BLADE) ×3
CLOTH BEACON ORANGE TIMEOUT ST (SAFETY) ×3 IMPLANT
COVER LIGHT HANDLE STERIS (MISCELLANEOUS) ×6 IMPLANT
DRAPE PROXIMA HALF (DRAPES) ×3 IMPLANT
ELECT BLADE 6 FLAT ULTRCLN (ELECTRODE) ×3 IMPLANT
ELECT REM PT RETURN 9FT ADLT (ELECTROSURGICAL) ×3
ELECTRODE REM PT RTRN 9FT ADLT (ELECTROSURGICAL) ×1 IMPLANT
FORMALIN 10 PREFIL 120ML (MISCELLANEOUS) ×3 IMPLANT
GLOVE BIOGEL PI IND STRL 7.0 (GLOVE) IMPLANT
GLOVE BIOGEL PI IND STRL 9 (GLOVE) ×1 IMPLANT
GLOVE BIOGEL PI INDICATOR 7.0 (GLOVE) ×4
GLOVE BIOGEL PI INDICATOR 9 (GLOVE) ×2
GLOVE ECLIPSE 9.0 STRL (GLOVE) ×3 IMPLANT
GOWN SPEC L3 XXLG W/TWL (GOWN DISPOSABLE) ×3 IMPLANT
GOWN STRL REUS W/TWL LRG LVL3 (GOWN DISPOSABLE) ×3 IMPLANT
HEMOSTAT SURGICEL 4X8 (HEMOSTASIS) ×2 IMPLANT
KIT ROOM TURNOVER AP CYSTO (KITS) ×3 IMPLANT
MANIFOLD NEPTUNE II (INSTRUMENTS) ×3 IMPLANT
PACK PERI GYN (CUSTOM PROCEDURE TRAY) ×3 IMPLANT
PAD ARMBOARD 7.5X6 YLW CONV (MISCELLANEOUS) ×3 IMPLANT
SET BASIN LINEN APH (SET/KITS/TRAYS/PACK) ×3 IMPLANT
SUT CHROMIC 2 0 CT 1 (SUTURE) ×7 IMPLANT

## 2014-10-30 NOTE — Transfer of Care (Signed)
Immediate Anesthesia Transfer of Care Note  Patient: Jasmine Morrison  Procedure(s) Performed: Procedure(s) (LRB): CONIZATION CERVIX WITH BIOPSY (cold knife) (N/A)  Patient Location: PACU  Anesthesia Type: general  Level of Consciousness: sedated  Airway & Oxygen Therapy: Patient Spontanous Breathing. Non Rebreather.  Post-op Assessment: Report given to PACU RN, Post -op Vital signs reviewed and stable and Patient moving all extremities  Post vital signs: Reviewed and stable  Complications: No apparent anesthesia complications

## 2014-10-30 NOTE — Anesthesia Preprocedure Evaluation (Signed)
Anesthesia Evaluation  Patient identified by MRN, date of birth, ID band Patient awake    Reviewed: Allergy & Precautions, NPO status , Patient's Chart, lab work & pertinent test results, reviewed documented beta blocker date and time   Airway Mallampati: II  TM Distance: >3 FB     Dental  (+) Poor Dentition, Loose, Missing, Chipped, Dental Advisory Given,    Pulmonary Current Smoker,  breath sounds clear to auscultation        Cardiovascular Rhythm:Regular Rate:Normal     Neuro/Psych  Headaches, PSYCHIATRIC DISORDERS (PTSD) Depression    GI/Hepatic GERD-  Controlled and Medicated,  Endo/Other  diabetes, Type 2, Oral Hypoglycemic AgentsMorbid obesity  Renal/GU      Musculoskeletal   Abdominal (+) + obese,   Peds  Hematology   Anesthesia Other Findings   Reproductive/Obstetrics                             Anesthesia Physical Anesthesia Plan  ASA: III  Anesthesia Plan: General   Post-op Pain Management:    Induction: Intravenous, Rapid sequence and Cricoid pressure planned  Airway Management Planned: Oral ETT  Additional Equipment:   Intra-op Plan:   Post-operative Plan: Extubation in OR  Informed Consent: I have reviewed the patients History and Physical, chart, labs and discussed the procedure including the risks, benefits and alternatives for the proposed anesthesia with the patient or authorized representative who has indicated his/her understanding and acceptance.     Plan Discussed with:   Anesthesia Plan Comments:         Anesthesia Quick Evaluation
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
## 2014-10-30 NOTE — Brief Op Note (Signed)
10/30/2014  8:51 AM  PATIENT:  Jasmine Morrison  44 y.o. female  PRE-OPERATIVE DIAGNOSIS:  moderate cervical dysplasia, endocervix involved  POST-OPERATIVE DIAGNOSIS:  moderate cervical dysplasia, endocervix involved  PROCEDURE:  Procedure(s): CONIZATION CERVIX WITH BIOPSY (cold knife) (N/A)  SURGEON:  Surgeon(s) and Role:    * Jonnie Kind, MD - Primary  PHYSICIAN ASSISTANT:   ASSISTANTS: Henderson CST   ANESTHESIA:   general  EBL:  Total I/O In: 900 [I.V.:900] Out: 20 [Blood:20]  BLOOD ADMINISTERED:none  DRAINS: none   LOCAL MEDICATIONS USED:  MARCAINE    and Amount: 10 ml  SPECIMEN:  Source of Specimen:  Cervical conization specimen  DISPOSITION OF SPECIMEN:  PATHOLOGY suture at 12:00  COUNTS:  YES  TOURNIQUET:  * No tourniquets in log *  DICTATION: .Dragon Dictation  PLAN OF CARE: Discharge to home after PACU  PATIENT DISPOSITION:  PACU - hemodynamically stable.   Delay start of Pharmacological VTE agent (>24hrs) due to surgical blood loss or risk of bleeding: not applicable Details of procedure: Patient was taken to the operating room prepped and draped for vaginal procedure. Timeout conducted and Procedure confirmed by surgical team. Cervix was visualized through the speculum, her's with 2-0 chromic placed at 3 and 9:00 to control cervical branches of the uterine vessels, then a circumferential sharp incision with #13 blade performed through the epithelium and removed and a conical's specimen, a 1.5 cm long by 1.5 cm wide conization specimen with good tissue orientation. Suture was placed at 12:00 for orientation of pathology Sturmdorf sutures were placed in both upper quadrants and at 6:00, Monsel's applied and then Surgicel loosely attached to the cervix patient recovery and she'll be seen in 1 week in the office to pathology and removal of Surgicel . sponge and needle counts correct

## 2014-10-30 NOTE — Op Note (Signed)
See the brief operative note for details

## 2014-10-30 NOTE — Discharge Instructions (Signed)
Conization of the Cervix, Care After °Refer to this sheet in the next few weeks. These instructions provide you with information on caring for yourself after your procedure. Your health care provider may also give you more specific instructions. Your treatment has been planned according to current medical practices but problems sometimes occur. Call your health care provider if you have any problems or questions after your procedure. °WHAT TO EXPECT AFTER THE PROCEDURE °After your procedure, it is typical to have the following sensations: °· If you had a general anesthetic, you may be groggy for 2-3 hours after the procedure. °· You may have cramps (similar to menstrual cramps) for about 1 week.   °· You may have a bloody discharge or light to moderate bleeding for 1-2 weeks.  The bleeding should not be heavy (for example, it should not soak 1 pad in less than 1 hour). °· You may have a black vaginal discharge that looks similar to coffee grounds. This is from the paste that was applied to the cervix to control bleeding. This is normal. °Recovery may take up to 3 weeks.  °HOME CARE INSTRUCTIONS  °· Arrange for someone to drive you home after the procedure. °· Only take medicines as directed by your health care provider. Do not take aspirin. It can cause bleeding.   °· Take showers for the first week. Do not take baths, swim, or use hot tubs until your health care provider says it is okay.   °· Do not douche, use tampons, or have sexual intercourse until your health care provider says it is okay.   °· Avoid strenuous activities, exercises, and heavy lifting for at least 7-14 days. °· You may resume your normal diet unless your health care provider advises you differently.     °· If you are constipated, you may: °¨ Take a mild laxative as directed by your health care provider.   °¨ Add fruit and bran to your diet.   °¨ Make sure to drink enough fluids to keep your urine clear or pale yellow. °· Keep follow-up  appointments with your health care provider. °SEEK MEDICAL CARE IF:  °· You develop a rash.   °· You are dizzy or lightheaded.   °· You feel nauseous.   °· You develop a bad smelling vaginal discharge. °SEEK IMMEDIATE MEDICAL CARE IF:  °· You have blood clots or bleeding that is heavier than a normal menstrual period (for example, soaking a pad in less than 1 hour) or you develop bright red bleeding.   °· You have a fever over 101°F (38.3°C) or persistent symptoms for more than 2-3 days.   °· You have a fever over 101°F (38.3°C) and your symptoms suddenly get worse. °· You have increasing cramps.   °· You faint.   °· You have pain when urinating. °· You have bloody urine.   °· You start vomiting.   °· Your pain is not relieved with your medicine.   °· Your have severe or worsening pain. °MAKE SURE YOU: °· Understand these instructions. °· Will watch your condition. °· Will get help right away if you are not doing well or get worse. °Document Released: 06/29/2005 Document Revised: 07/04/2013 Document Reviewed: 12/23/2012 °[employer]® Patient Information ©2015 [employer]. This information is not intended to replace advice given to you by your health care provider. Make sure you discuss any questions you have with your health care provider. ° °

## 2014-10-30 NOTE — Anesthesia Postprocedure Evaluation (Signed)
Anesthesia Post Note  Patient: Jasmine Morrison  Procedure(s) Performed: Procedure(s) (LRB): CONIZATION CERVIX WITH BIOPSY (cold knife) (N/A)  Anesthesia type: General  Patient location: PACU  Post pain: Pain level controlled  Post assessment: Post-op Vital signs reviewed, Patient's Cardiovascular Status Stable, Respiratory Function Stable, Patent Airway, No signs of Nausea or vomiting and Pain level controlled    Post vital signs: Reviewed and stable  Level of consciousness: awake and alert   Complications: No apparent anesthesia complications

## 2014-10-30 NOTE — Anesthesia Procedure Notes (Signed)
Procedure Name: Intubation Date/Time: 10/30/2014 7:41 AM Performed by: Vista Deck Pre-anesthesia Checklist: Patient identified, Patient being monitored, Timeout performed, Emergency Drugs available and Suction available Patient Re-evaluated:Patient Re-evaluated prior to inductionOxygen Delivery Method: Circle System Utilized Preoxygenation: Pre-oxygenation with 100% oxygen Intubation Type: IV induction, Rapid sequence and Cricoid Pressure applied Ventilation: Mask ventilation without difficulty Laryngoscope Size: Miller and 2 Grade View: Grade I Tube type: Oral Tube size: 7.0 mm Number of attempts: 1 Airway Equipment and Method: Stylet Placement Confirmation: ETT inserted through vocal cords under direct vision,  positive ETCO2 and breath sounds checked- equal and bilateral Secured at: 21 cm Tube secured with: Tape Dental Injury: Teeth and Oropharynx as per pre-operative assessment

## 2014-10-31 ENCOUNTER — Encounter (HOSPITAL_COMMUNITY): Payer: Self-pay | Admitting: Obstetrics and Gynecology

## 2014-11-07 ENCOUNTER — Encounter: Payer: Self-pay | Admitting: Obstetrics and Gynecology

## 2014-11-07 ENCOUNTER — Ambulatory Visit (INDEPENDENT_AMBULATORY_CARE_PROVIDER_SITE_OTHER): Payer: Medicaid Other | Admitting: Obstetrics and Gynecology

## 2014-11-07 VITALS — BP 110/60 | Ht 63.0 in | Wt 243.0 lb

## 2014-11-07 DIAGNOSIS — Z9889 Other specified postprocedural states: Secondary | ICD-10-CM

## 2014-11-07 DIAGNOSIS — N871 Moderate cervical dysplasia: Secondary | ICD-10-CM

## 2014-11-07 MED ORDER — METRONIDAZOLE 500 MG PO TABS: 500.0000 mg | ORAL_TABLET | Freq: Two times a day (BID) | ORAL | Status: DC

## 2014-11-07 NOTE — Progress Notes (Signed)
Patient ID: Jasmine Morrison, female   DOB: 01-15-1971, 44 y.o.   MRN: [id number]  Subjective:  Jasmine Morrison is a 44 y.o. female now 8 days status post cervical conization with biopsy.  Patient here for exam following surgery to check healing, and gauze removal.  Patient notes some cramping since surgery.    Patient is not currently working., not sexually active  Review of Systems Negative except n/a r diet:   normal   Bowel movements : normal.  Pain is controlled with Norco and ibuprofen.  Objective:  BP 110/60 mmHg  Ht 5\' 3"  (1.6 m)  Wt 243 lb (110.224 kg)  BMI 43.06 kg/m2 General:Well developed, well nourished.  No acute distress. Abdomen: Bowel sounds normal, soft, non-tender. Pelvic Exam:    External Genitalia:  Normal.    Vagina: Normal    Cervix: healing reapidly, sutures and gauze have resolved.    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   Plan:  1.Wound care discussed   2. . current medications. N/a  2a. Will Rx metronidazole, BID for 10 days 3. Activity restrictions: no sexual activity  4. return to work: not applicable. 5. Follow up in 4 weeks for recheck and 3 months for cervix recheck AND CONSIDERATION OF LEEP TO OBTAIN CLEAR MARGIN.  IF MARGINS CANNOT BEEXPECTED TO BE CLEARED, SHOULD BE CONSIDERED FOR HYSTERECTOMY.   This chart was SCRIBED for Mallory Shirk, MD by Stephania Fragmin, ED Scribe. This patient was seen in room 2 and the patient's care was started at 10:35 AM.  I personally performed the services described in this documentation, which was SCRIBED in my presence. The recorded information has been reviewed and considered accurate. It has been edited as necessary during review. Jonnie Kind, MD

## 2014-11-07 NOTE — Progress Notes (Signed)
Patient ID: Jasmine Morrison, female   DOB: 25-May-1971, 44 y.o.   MRN: [id number] Pt here today for post op visit. Pt denies any problems or concerns at this time.

## 2014-11-30 ENCOUNTER — Ambulatory Visit (INDEPENDENT_AMBULATORY_CARE_PROVIDER_SITE_OTHER): Payer: Medicaid Other | Admitting: Obstetrics and Gynecology

## 2014-11-30 ENCOUNTER — Encounter: Payer: Self-pay | Admitting: Obstetrics and Gynecology

## 2014-11-30 VITALS — BP 110/62 | Ht 63.0 in | Wt 242.0 lb

## 2014-11-30 DIAGNOSIS — Z9889 Other specified postprocedural states: Secondary | ICD-10-CM

## 2014-11-30 DIAGNOSIS — Z09 Encounter for follow-up examination after completed treatment for conditions other than malignant neoplasm: Secondary | ICD-10-CM

## 2014-11-30 NOTE — Progress Notes (Signed)
Patient ID: Jasmine Morrison, female   DOB: 1971-05-22, 44 y.o.   MRN: [id number] Pt here today for post op visit. Pt states that she has been hurting on both sides, right side is sharp pain and the left side feels like cramping.  Subjective:     Jasmine Morrison is a 44 y.o. female who presents to the clinic 4 weeks status post CKC for Cin III. Diet:       regular without difficulty. Bowel function is: normal. Pain:     Pain is controlled without any medications.    Pathology. : CIN III with involvement of the endocervical margin  Review of Systems Pertinent items are noted in HPI.    Objective:    BP 110/62 mmHg  Ht 5\' 3"  (1.6 m)  Wt 242 lb (109.77 kg)  BMI 42.88 kg/m2 General:  alert, cooperative, appears stated age, no distress and morbidly obese  Abdomen: soft, bowel sounds active, non-tender  Incision:   healing well, no drainage, no erythema, no hernia, no seroma, no swelling, no dehiscence, incision well approximated       Pelvic: EFG normal                Vagina: normal                Cervix: healing well s/p ckc                      Moderate descensus    Assessment:    Doing well postoperatively. Operative findings again reviewed. Pathology report discussed.   Given the ENDOCERVICAL INVOLVEMENT, TREATMENT OPTIONS OF REPEAT CONIZATION VERSUS PROCEEDING WITH VAGINAL HYSTERECTOMY DISCUSSED.  pt desires to proceed to vaginal hysterectomy Plan:    1. Continue any current medications. 2. Wound care discussed. 3. Activity restrictions: none 4. Anticipated return to work: not applicable. 5. Follow up: 3 weeks for  PREOP VISIT FOR VAGINAL HYSTERECTOMY, POSSIBLE BILATERAL SALPINGECTOMY.

## 2014-12-04 ENCOUNTER — Telehealth: Payer: Self-pay | Admitting: *Deleted

## 2014-12-04 ENCOUNTER — Other Ambulatory Visit: Payer: Self-pay | Admitting: Obstetrics & Gynecology

## 2014-12-04 MED ORDER — HYDROCODONE-ACETAMINOPHEN 5-325 MG PO TABS: 1.0000 | ORAL_TABLET | Freq: Four times a day (QID) | ORAL | Status: DC | PRN

## 2014-12-04 NOTE — Telephone Encounter (Signed)
Pt states that she is still having some pain in her stomach and wanted to know if Dr. Glo Herring could refill her pain medication. I advised the pt that he is out of the office until next Tuesday and that I would have to send it to one of the other providers and see if they could refill it for her. I advised the pt that as sson as I knew either way I would let her know. Pt verbalized understanding.

## 2014-12-06 NOTE — Progress Notes (Signed)
Will schedule surgery at time of pre-op AHB

## 2014-12-06 NOTE — Telephone Encounter (Signed)
Pt given 10 tabs 2 days ago

## 2014-12-12 ENCOUNTER — Telehealth: Payer: Self-pay | Admitting: *Deleted

## 2014-12-12 NOTE — Telephone Encounter (Signed)
Pt states she received a phone call from our office but not sure who called, states she thought call could be in regard to surgery with Dr. Glo Herring. Spoke with Dawn Little who states will contact pt in regards to the surgery after discussing with Jerene Dilling who is out of the office today. Pt verbalized understanding.

## 2014-12-21 ENCOUNTER — Encounter: Payer: Self-pay | Admitting: Obstetrics and Gynecology

## 2014-12-21 ENCOUNTER — Ambulatory Visit (INDEPENDENT_AMBULATORY_CARE_PROVIDER_SITE_OTHER): Payer: Medicaid Other | Admitting: Obstetrics and Gynecology

## 2014-12-21 VITALS — BP 90/50 | HR 72 | Ht 63.0 in | Wt 240.6 lb

## 2014-12-21 DIAGNOSIS — N871 Moderate cervical dysplasia: Secondary | ICD-10-CM | POA: Diagnosis not present

## 2014-12-21 DIAGNOSIS — Z01818 Encounter for other preprocedural examination: Secondary | ICD-10-CM | POA: Diagnosis not present

## 2014-12-21 MED ORDER — HYDROCODONE-HOMATROPINE 5-1.5 MG/5ML PO SYRP
5.0000 mL | ORAL_SOLUTION | Freq: Four times a day (QID) | ORAL | Status: DC | PRN
Start: 2014-12-21 — End: 2015-02-19
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
## 2014-12-24 LAB — GC/CHLAMYDIA PROBE AMP
Chlamydia trachomatis, NAA: NEGATIVE
NEISSERIA GONORRHOEAE BY PCR: NEGATIVE

## 2015-01-02 DIAGNOSIS — Z029 Encounter for administrative examinations, unspecified: Secondary | ICD-10-CM

## 2015-01-09 NOTE — Patient Instructions (Signed)
FAELYNN WYNDER  01/09/2015     @PREFPERIOPPHARMACY @   Your procedure is scheduled on 01/15/2015.  Report to [facility] Admitting at 7:20 A.M.  Call this number if you have problems the morning of surgery:  [phone]   Remember:  Do not eat food or drink liquids after midnight.  Take these medicines the morning of surgery with A SIP OF WATER:  Trazadone, Inderal, Buspar, Cymbalta, Neurontin, Vicodin and Lisinopril   Do not wear jewelry, make-up or nail polish.  Do not wear lotions, powders, or perfumes.  You may wear deodorant.  Do not shave 48 hours prior to surgery.  Men may shave face and neck.  Do not bring valuables to the hospital.  [facility] is not responsible for any belongings or valuables.  Contacts, dentures or bridgework may not be worn into surgery.  Leave your suitcase in the car.  After surgery it may be brought to your room.  For patients admitted to the hospital, discharge time will be determined by your treatment team.  Patients discharged the day of surgery will not be allowed to drive home.   Name and phone number of your driver:   family Special instructions:  n/a  Please read over the following fact sheets that you were given. Care and Recovery After Surgery   Laparoscopically Assisted Vaginal Hysterectomy  A laparoscopically assisted vaginal hysterectomy (LAVH) is a surgical procedure to remove the uterus and cervix, and sometimes the ovaries and fallopian tubes. During an LAVH, some of the surgical removal is done through the vagina, and the rest is done through a few small surgical cuts (incisions) in the abdomen.  This procedure is usually considered in women when a vaginal hysterectomy is not an option. Your health care provider will discuss the risks and benefits of the different surgical techniques at your appointment. Generally, recovery time is faster and there are fewer complications after laparoscopic procedures than after open  incisional procedures. LET [facility] CARE PROVIDER KNOW ABOUT:   Any allergies you have.  All medicines you are taking, including vitamins, herbs, eye drops, creams, and over-the-counter medicines.  Previous problems you or members of your family have had with the use of anesthetics.  Any blood disorders you have.  Previous surgeries you have had.  Medical conditions you have. RISKS AND COMPLICATIONS Generally, this is a safe procedure. However, as with any procedure, complications can occur. Possible complications include:  Allergies to medicines.  Difficulty breathing.  Bleeding.  Infection.  Damage to other structures near your uterus and cervix. BEFORE THE PROCEDURE  Ask your health care provider about changing or stopping your regular medicines.  Take certain medicines, such as a colon-emptying preparation, as directed.  Do not eat or drink anything for at least 8 hours before your surgery.  Stop smoking if you smoke. Stopping will improve your health after surgery.  Arrange for a ride home after surgery and for help at home during recovery. PROCEDURE   An IV tube will be put into one of your veins in order to give you fluids and medicines.  You will receive medicines to relax you and medicines that make you sleep (general anesthetic).  You may have a flexible tube (catheter) put into your bladder to drain urine.  You may have a tube put through your nose or mouth that goes into your stomach (nasogastric tube). The nasogastric tube removes digestive fluids and prevents you from feeling nauseated and from vomiting.  Tight-fitting (compression) stockings will be placed on your legs to promote circulation.  Three to four small incisions will be made in your abdomen. An incision also will be made in your vagina. Probes and tools will be inserted into the small incisions. The uterus and cervix are removed (and possibly your ovaries and fallopian tubes) through your  vagina as well as through the small incisions that were made in the abdomen.  Your vagina is then sewn back to normal. AFTER THE PROCEDURE  You may have a liquid diet temporarily. You will most likely return to, and tolerate, your usual diet the day after surgery.  You will be passing urine through a catheter. It will be removed the day after surgery.  Your temperature, breathing rate, heart rate, blood pressure, and oxygen level will be monitored regularly.  You will still wear compression stockings on your legs until you are able to move around.  You will use a special device or do breathing exercises to keep your lungs clear.  You will be encouraged to walk as soon as possible. Document Released: 06/18/2011 Document Revised: 03/01/2013 Document Reviewed: 01/12/2013 [facility] Patient Information 2015 [place], [place]. This information is not intended to replace advice given to you by your health care provider. Make sure you discuss any questions you have with your health care provider.

## 2015-01-10 ENCOUNTER — Encounter (HOSPITAL_COMMUNITY)
Admission: RE | Admit: 2015-01-10 | Discharge: 2015-01-10 | Disposition: A | Discharge status: Home / Self Care | Payer: Medicaid Other | Source: Ambulatory Visit | Attending: Obstetrics and Gynecology | Admitting: Obstetrics and Gynecology

## 2015-01-10 ENCOUNTER — Encounter (HOSPITAL_COMMUNITY): Payer: Self-pay

## 2015-01-10 ENCOUNTER — Other Ambulatory Visit: Payer: Self-pay | Admitting: Obstetrics and Gynecology

## 2015-01-10 DIAGNOSIS — Z01818 Encounter for other preprocedural examination: Secondary | ICD-10-CM | POA: Insufficient documentation

## 2015-01-10 DIAGNOSIS — D069 Carcinoma in situ of cervix, unspecified: Secondary | ICD-10-CM

## 2015-01-10 HISTORY — DX: Essential (primary) hypertension: I10

## 2015-01-10 LAB — TYPE AND SCREEN
ABO/RH(D): O POS
ANTIBODY SCREEN: NEGATIVE

## 2015-01-10 LAB — COMPREHENSIVE METABOLIC PANEL
ALK PHOS: 74 U/L (ref 38–126)
ALT: 19 U/L (ref 14–54)
AST: 24 U/L (ref 15–41)
Albumin: 3.6 g/dL (ref 3.5–5.0)
Anion gap: 9 (ref 5–15)
BILIRUBIN TOTAL: 1.3 mg/dL — AB (ref 0.3–1.2)
BUN: 12 mg/dL (ref 6–20)
CO2: 21 mmol/L — ABNORMAL LOW (ref 22–32)
Calcium: 8.5 mg/dL — ABNORMAL LOW (ref 8.9–10.3)
Chloride: 106 mmol/L (ref 101–111)
Creatinine, Ser: 0.71 mg/dL (ref 0.44–1.00)
GFR calc Af Amer: >60 mL/min (ref >60)
Glucose, Bld: 155 mg/dL — ABNORMAL HIGH (ref 65–99)
Potassium: 5 mmol/L (ref 3.5–5.1)
Sodium: 136 mmol/L (ref 135–145)
TOTAL PROTEIN: 6.8 g/dL (ref 6.5–8.1)

## 2015-01-10 LAB — URINALYSIS, ROUTINE W REFLEX MICROSCOPIC
Bilirubin Urine: NEGATIVE
Glucose, UA: NEGATIVE mg/dL
Ketones, ur: NEGATIVE mg/dL
Leukocytes, UA: NEGATIVE
Nitrite: NEGATIVE
PH: 6 (ref 5.0–8.0)
PROTEIN: NEGATIVE mg/dL
Specific Gravity, Urine: 1.02 (ref 1.005–1.030)
UROBILINOGEN UA: 0.2 mg/dL (ref 0.0–1.0)

## 2015-01-10 LAB — HCG, SERUM, QUALITATIVE: PREG SERUM: NEGATIVE

## 2015-01-10 LAB — URINE MICROSCOPIC-ADD ON

## 2015-01-11 LAB — RPR: RPR Ser Ql: NONREACTIVE

## 2015-01-14 ENCOUNTER — Telehealth: Payer: Self-pay | Admitting: Obstetrics and Gynecology
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
## 2015-01-15 ENCOUNTER — Ambulatory Visit (HOSPITAL_COMMUNITY)
Admission: RE | Admit: 2015-01-15 | Payer: Medicaid Other | Source: Ambulatory Visit | Admitting: Obstetrics and Gynecology

## 2015-01-15 ENCOUNTER — Encounter (HOSPITAL_COMMUNITY): Admission: RE | Payer: Self-pay | Source: Ambulatory Visit

## 2015-01-15 SURGERY — HYSTERECTOMY, VAGINAL
Anesthesia: Choice

## 2015-01-25 ENCOUNTER — Encounter: Payer: Medicaid Other | Admitting: Obstetrics and Gynecology

## 2015-02-01 ENCOUNTER — Encounter: Payer: Medicaid Other | Admitting: Obstetrics and Gynecology

## 2015-02-21 NOTE — Pre-Procedure Instructions (Signed)
Jasmine Morrison  02/21/2015      [facility] [address] [place] - [address] [place] [address] Phone: [phone] Fax: [phone]    Your procedure is scheduled on : Friday, Aug. 19  Report to [place] at 5:30 A.M.  Call this number if you have problems the morning of surgery:  [phone]              Any questions prior to surgery call [phone](Monday--Friday 8 AM-4 PM)   Remember:    Do not eat food or drink liquids after midnight on Thursday, Aug. 18              Do not take the evening dose of metformin (glucophage) the day before surgery.   Take these medicines the morning of surgery with A SIP OF WATER : buspar, cymbalta, gabapentin, hydrocodone if needed, inderal (propranolol)               Stop supplements, herbal medications and non sterodial anti-inflammatory medicines : advil, ibuprofen  1 week prior to surgery   How to Manage Your Diabetes Before Surgery   Why is it important to control my blood sugar before and after surgery?   Improving blood sugar levels before and after surgery helps healing and can limit problems.  A way of improving blood sugar control is eating a healthy diet by:  - Eating less sugar and carbohydrates  - Increasing activity/exercise  - Talk with your doctor about reaching your blood sugar goals  High blood sugars (greater than 180 mg/dL) can raise your risk of infections and slow down your recovery so you will need to focus on controlling your diabetes during the weeks before surgery.  Make sure that the doctor who takes care of your diabetes knows about your planned surgery including the date and location.  How do I manage my blood sugars before surgery?   Check your blood sugar at least 4 times a day, 2 days before surgery to make sure that they are not too high or low.   Check your blood sugar the morning of your surgery when you wake up and every 2               hours  until you get to the Short-Stay unit.  If your blood sugar is less than 70 mg/dL, you will need to treat for low blood sugar by:  Treat a low blood sugar (less than 70 mg/dL) with 1/2 cup of clear juice (cranberry or apple), 4 glucose tablets, OR glucose gel.  Recheck blood sugar in 15 minutes after treatment (to make sure it is greater than 70 mg/dL).  If blood sugar is not greater than 70 mg/dL on re-check, call [phone] for further instructions.   Report your blood sugar to the Short-Stay nurse when you get to Short-Stay.  References:  [facility], 2007 "How to Manage your Diabetes Before and After Surgery".  What do I do about my diabetes medications?   Do not take oral diabetes medicines (pills) the morning of surgery.    Do not wear jewelry, make-up or nail polish.  Do not wear lotions, powders, or perfumes.  You may wear deodorant.  Do not shave 48 hours prior to surgery.  Men may shave face and neck.  Do not bring valuables to the hospital.  [facility] is not responsible for any belongings or valuables.  Contacts, dentures or bridgework may not be worn into surgery.  Leave your suitcase in the car.  After surgery it may be brought to your room.  For patients admitted to the hospital, discharge time will be determined by your treatment team.  Patients discharged the day of surgery will not be allowed to drive home.   Name and phone number of your driver:    Special instructions:  Review preparing for surgery handout  Please read over the following fact sheets that you were given. Pain Booklet, Coughing and Deep Breathing and Surgical Site Infection Prevention

## 2015-02-22 ENCOUNTER — Encounter (HOSPITAL_COMMUNITY)
Admission: RE | Admit: 2015-02-22 | Discharge: 2015-02-22 | Disposition: A | Discharge status: Home / Self Care | Payer: Medicaid Other | Source: Ambulatory Visit | Attending: Oral Surgery | Admitting: Oral Surgery

## 2015-02-22 ENCOUNTER — Encounter (HOSPITAL_COMMUNITY): Payer: Self-pay

## 2015-02-22 DIAGNOSIS — Z01812 Encounter for preprocedural laboratory examination: Secondary | ICD-10-CM | POA: Diagnosis present

## 2015-02-22 DIAGNOSIS — K029 Dental caries, unspecified: Secondary | ICD-10-CM | POA: Insufficient documentation

## 2015-02-22 DIAGNOSIS — K0530 Chronic periodontitis, unspecified: Secondary | ICD-10-CM | POA: Insufficient documentation

## 2015-02-22 HISTORY — DX: Personal history of other diseases of the digestive system: Z87.19

## 2015-02-22 LAB — BASIC METABOLIC PANEL
Anion gap: 6 (ref 5–15)
BUN: 13 mg/dL (ref 6–20)
CHLORIDE: 106 mmol/L (ref 101–111)
CO2: 24 mmol/L (ref 22–32)
CREATININE: 0.82 mg/dL (ref 0.44–1.00)
Calcium: 8.7 mg/dL — ABNORMAL LOW (ref 8.9–10.3)
GFR calc Af Amer: >60 mL/min (ref >60)
GFR calc non Af Amer: >60 mL/min (ref >60)
Glucose, Bld: 131 mg/dL — ABNORMAL HIGH (ref 65–99)
Potassium: 5.2 mmol/L — ABNORMAL HIGH (ref 3.5–5.1)
Sodium: 136 mmol/L (ref 135–145)

## 2015-02-22 LAB — CBC
HEMATOCRIT: 44 % (ref 36.0–46.0)
Hemoglobin: 14.8 g/dL (ref 12.0–15.0)
MCH: 30.3 pg (ref 26.0–34.0)
MCHC: 33.6 g/dL (ref 30.0–36.0)
MCV: 90 fL (ref 78.0–100.0)
PLATELETS: 175 10*3/uL (ref 150–400)
RBC: 4.89 MIL/uL (ref 3.87–5.11)
RDW: 13.5 % (ref 11.5–15.5)
WBC: 10.7 10*3/uL — ABNORMAL HIGH (ref 4.0–10.5)

## 2015-02-22 LAB — GLUCOSE, CAPILLARY: Glucose-Capillary: 142 mg/dL — ABNORMAL HIGH (ref 65–99)

## 2015-02-22 NOTE — Progress Notes (Addendum)
PCP: [facility]. Dr. Rhea Belton.  Podiatry: Dr. Barkley Bruns @ [place] : states she has a growth on her toe on right foot  and is painful.  [place]: States she visits monthly and has an appointment Tues. 8/16. Pt. States she sometimes has thoughts  of hurting herself but no others at times. States she is on medications and if she needs to talk to a counselor she will contact them. States she can call them anytime. States she is not going to hurt herself. Refused to speak to a Education officer, museum today.   Pt. Takes propanolol for headaches.

## 2015-02-22 NOTE — Progress Notes (Signed)
   02/22/15 0926  OBSTRUCTIVE SLEEP APNEA  Do you often feel tired, fatigued, or sleepy during the daytime? 1  Has anyone observed you stop breathing during your sleep? 0  Do you have, or are you being treated for high blood pressure? 1  BMI more than 35 kg/m2? 1  Age over 45 years old? 0  Neck circumference greater than 40 cm/16 inches? 1  Gender: 0

## 2015-02-23 LAB — HEMOGLOBIN A1C
Hgb A1c MFr Bld: 6.2 % — ABNORMAL HIGH (ref 4.8–5.6)
MEAN PLASMA GLUCOSE: 131 mg/dL

## 2015-02-26 NOTE — H&P (Signed)
HISTORY AND PHYSICAL  Jasmine Morrison is a 44 y.o. female patient with CC: Painful teeth. Referred by general dentist for full mouth extgractions  No diagnosis found.  Past Medical History  Diagnosis Date  . PTSD (post-traumatic stress disorder)   . Diabetes mellitus without complication   . Depression   . Neuropathy   . Migraines   . Hypertension   . History of hiatal hernia   . Cancer 09/2014    cervix    No current facility-administered medications for this encounter.   Current Outpatient Prescriptions  Medication Sig Dispense Refill  . busPIRone (BUSPAR) 5 MG tablet Take 5 mg by mouth 2 (two) times daily.    Marland Kitchen CINNAMON PO Take 1 tablet by mouth daily.    . DULoxetine (CYMBALTA) 30 MG capsule Take 30 mg by mouth daily.    . DULoxetine (CYMBALTA) 60 MG capsule Take 60 mg by mouth daily.    Marland Kitchen gabapentin (NEURONTIN) 300 MG capsule Take 300 mg by mouth 2 (two) times daily.    Marland Kitchen HYDROcodone-acetaminophen (NORCO/VICODIN) 5-325 MG per tablet Take 1 tablet by mouth every 6 (six) hours as needed. (Patient taking differently: Take 1 tablet by mouth every 6 (six) hours as needed for moderate pain. ) 10 tablet 0  . ibuprofen (ADVIL,MOTRIN) 600 MG tablet Take 1 tablet (600 mg total) by mouth every 6 (six) hours as needed. 30 tablet 1  . lisinopril (PRINIVIL,ZESTRIL) 5 MG tablet Take 5 mg by mouth daily.    . metFORMIN (GLUCOPHAGE) 500 MG tablet Take 500 mg by mouth 2 (two) times daily with a meal.    . Multiple Vitamin (MULTIVITAMIN) tablet Take 1 tablet by mouth daily.    . Multiple Vitamins-Minerals (ECHINACEA ACZ PO) Take 1 tablet by mouth daily.    . Omega-3 Fatty Acids (FISH OIL PO) Take 1 capsule by mouth daily.    . propranolol (INDERAL) 20 MG tablet Take 20 mg by mouth 2 (two) times daily.    . traZODone (DESYREL) 100 MG tablet Take 100 mg by mouth at bedtime.    Marland Kitchen VITAMIN E PO Take 1 tablet by mouth daily.     Allergies  Allergen Reactions  . Buspar [Buspirone] Hives and  Itching    10 mg   Active Problems:   * No active hospital problems. *  Vitals: There were no vitals taken for this visit. Lab results:No results found for this or any previous visit (from the past 36 hour(s)). Radiology Results: No results found. General appearance: alert, cooperative and morbidly obese Head: Normocephalic, without obvious abnormality, atraumatic Eyes: negative Nose: Nares normal. Septum midline. Mucosa normal. No drainage or sinus tenderness. Throat: Rampant dental caries; No purulence, trismus. Pharynx clear Neck: no adenopathy, supple, symmetrical, trachea midline and thyroid not enlarged, symmetric, no tenderness/mass/nodules Resp: clear to auscultation bilaterally Cardio: regular rate and rhythm, S1, S2 normal, no murmur, click, rub or gallop  Assessment:All teeth non-restorable secondary to dental caries  Plan: Full mouth extraction with alveoloplasty. General anesthesia, nasal intubation. Day surgery.   Gae Bon 02/26/2015

## 2015-02-28 ENCOUNTER — Other Ambulatory Visit (HOSPITAL_COMMUNITY): Payer: Medicaid Other

## 2015-02-28 MED ORDER — CEFAZOLIN SODIUM-DEXTROSE 2-3 GM-% IV SOLR
2.0000 g | INTRAVENOUS | Status: AC
  Administered 2015-03-01: 2 g via INTRAVENOUS
  Filled 2015-02-28: qty 50

## 2015-03-01 ENCOUNTER — Encounter (HOSPITAL_COMMUNITY): Payer: Self-pay | Admitting: *Deleted

## 2015-03-01 ENCOUNTER — Ambulatory Visit (HOSPITAL_COMMUNITY): Payer: Medicaid Other | Admitting: Anesthesiology

## 2015-03-01 ENCOUNTER — Ambulatory Visit (HOSPITAL_COMMUNITY)
Admission: RE | Admit: 2015-03-01 | Discharge: 2015-03-01 | Disposition: A | Discharge status: Home / Self Care | Payer: Medicaid Other | Source: Ambulatory Visit | Attending: Oral Surgery | Admitting: Oral Surgery

## 2015-03-01 ENCOUNTER — Encounter (HOSPITAL_COMMUNITY)
Admission: RE | Disposition: A | Discharge status: Home / Self Care | Payer: Self-pay | Source: Ambulatory Visit | Attending: Oral Surgery

## 2015-03-01 DIAGNOSIS — E1140 Type 2 diabetes mellitus with diabetic neuropathy, unspecified: Secondary | ICD-10-CM | POA: Insufficient documentation

## 2015-03-01 DIAGNOSIS — Z888 Allergy status to other drugs, medicaments and biological substances status: Secondary | ICD-10-CM | POA: Diagnosis not present

## 2015-03-01 DIAGNOSIS — K029 Dental caries, unspecified: Secondary | ICD-10-CM | POA: Diagnosis present

## 2015-03-01 DIAGNOSIS — G43909 Migraine, unspecified, not intractable, without status migrainosus: Secondary | ICD-10-CM | POA: Diagnosis not present

## 2015-03-01 DIAGNOSIS — F17210 Nicotine dependence, cigarettes, uncomplicated: Secondary | ICD-10-CM | POA: Diagnosis not present

## 2015-03-01 DIAGNOSIS — Z8541 Personal history of malignant neoplasm of cervix uteri: Secondary | ICD-10-CM | POA: Diagnosis not present

## 2015-03-01 DIAGNOSIS — I10 Essential (primary) hypertension: Secondary | ICD-10-CM | POA: Diagnosis not present

## 2015-03-01 DIAGNOSIS — F4310 Post-traumatic stress disorder, unspecified: Secondary | ICD-10-CM | POA: Diagnosis not present

## 2015-03-01 DIAGNOSIS — Z79899 Other long term (current) drug therapy: Secondary | ICD-10-CM | POA: Insufficient documentation

## 2015-03-01 DIAGNOSIS — K449 Diaphragmatic hernia without obstruction or gangrene: Secondary | ICD-10-CM | POA: Insufficient documentation

## 2015-03-01 DIAGNOSIS — F329 Major depressive disorder, single episode, unspecified: Secondary | ICD-10-CM | POA: Diagnosis not present

## 2015-03-01 HISTORY — PX: MULTIPLE EXTRACTIONS WITH ALVEOLOPLASTY: SHX5342

## 2015-03-01 LAB — GLUCOSE, CAPILLARY
GLUCOSE-CAPILLARY: 147 mg/dL — AB (ref 65–99)
Glucose-Capillary: 150 mg/dL — ABNORMAL HIGH (ref 65–99)

## 2015-03-01 SURGERY — MULTIPLE EXTRACTION WITH ALVEOLOPLASTY
Anesthesia: General | Site: Mouth

## 2015-03-01 MED ORDER — EPHEDRINE SULFATE 50 MG/ML IJ SOLN
INTRAMUSCULAR | Status: AC
  Filled 2015-03-01: qty 1

## 2015-03-01 MED ORDER — SUCCINYLCHOLINE CHLORIDE 20 MG/ML IJ SOLN
INTRAMUSCULAR | Status: AC
  Filled 2015-03-01: qty 1

## 2015-03-01 MED ORDER — HYDROMORPHONE HCL 1 MG/ML IJ SOLN: 0.2500 mg | INTRAMUSCULAR | Status: DC | PRN

## 2015-03-01 MED ORDER — LIDOCAINE-EPINEPHRINE 2 %-1:100000 IJ SOLN
INTRAMUSCULAR | Status: AC
  Filled 2015-03-01: qty 1

## 2015-03-01 MED ORDER — 0.9 % SODIUM CHLORIDE (POUR BTL) OPTIME
TOPICAL | Status: DC | PRN
  Administered 2015-03-01: 1000 mL

## 2015-03-01 MED ORDER — ALBUTEROL SULFATE HFA 108 (90 BASE) MCG/ACT IN AERS
INHALATION_SPRAY | RESPIRATORY_TRACT | Status: DC | PRN
  Administered 2015-03-01: 6 via RESPIRATORY_TRACT

## 2015-03-01 MED ORDER — FENTANYL CITRATE (PF) 100 MCG/2ML IJ SOLN
INTRAMUSCULAR | Status: DC | PRN
  Administered 2015-03-01: 100 ug via INTRAVENOUS
  Administered 2015-03-01: 50 ug via INTRAVENOUS

## 2015-03-01 MED ORDER — LIDOCAINE HCL (CARDIAC) 20 MG/ML IV SOLN
INTRAVENOUS | Status: DC | PRN
  Administered 2015-03-01: 80 mg via INTRAVENOUS

## 2015-03-01 MED ORDER — FENTANYL CITRATE (PF) 250 MCG/5ML IJ SOLN
INTRAMUSCULAR | Status: AC
  Filled 2015-03-01: qty 5

## 2015-03-01 MED ORDER — OXYCODONE-ACETAMINOPHEN 5-325 MG PO TABS: 1.0000 | ORAL_TABLET | ORAL | Status: DC | PRN

## 2015-03-01 MED ORDER — SODIUM CHLORIDE 0.9 % IR SOLN
Status: DC | PRN
  Administered 2015-03-01: 1000 mL

## 2015-03-01 MED ORDER — MIDAZOLAM HCL 5 MG/5ML IJ SOLN
INTRAMUSCULAR | Status: DC | PRN
  Administered 2015-03-01: 2 mg via INTRAVENOUS

## 2015-03-01 MED ORDER — LACTATED RINGERS IV SOLN
INTRAVENOUS | Status: DC | PRN
  Administered 2015-03-01: 08:00:00 via INTRAVENOUS

## 2015-03-01 MED ORDER — PROPOFOL 10 MG/ML IV BOLUS
INTRAVENOUS | Status: AC
  Filled 2015-03-01: qty 20

## 2015-03-01 MED ORDER — ONDANSETRON HCL 4 MG/2ML IJ SOLN
INTRAMUSCULAR | Status: DC | PRN
  Administered 2015-03-01: 4 mg via INTRAVENOUS

## 2015-03-01 MED ORDER — OXYMETAZOLINE HCL 0.05 % NA SOLN
2.0000 | NASAL | Status: AC
  Administered 2015-03-01 (×2): 2 via NASAL
  Filled 2015-03-01: qty 15

## 2015-03-01 MED ORDER — LIDOCAINE-EPINEPHRINE 2 %-1:100000 IJ SOLN
INTRAMUSCULAR | Status: DC | PRN
  Administered 2015-03-01: 19 mL

## 2015-03-01 MED ORDER — MIDAZOLAM HCL 2 MG/2ML IJ SOLN
INTRAMUSCULAR | Status: AC
  Filled 2015-03-01: qty 4

## 2015-03-01 MED ORDER — DEXAMETHASONE SODIUM PHOSPHATE 4 MG/ML IJ SOLN
INTRAMUSCULAR | Status: DC | PRN
  Administered 2015-03-01: 4 mg via INTRAVENOUS

## 2015-03-01 MED ORDER — PROMETHAZINE HCL 25 MG/ML IJ SOLN: 6.2500 mg | INTRAMUSCULAR | Status: DC | PRN

## 2015-03-01 MED ORDER — SUCCINYLCHOLINE CHLORIDE 20 MG/ML IJ SOLN
INTRAMUSCULAR | Status: DC | PRN
Start: 2015-03-01 — End: 2015-03-01
  Administered 2015-03-01: 120 mg via INTRAVENOUS

## 2015-03-01 MED ORDER — ROCURONIUM BROMIDE 50 MG/5ML IV SOLN
INTRAVENOUS | Status: AC
  Filled 2015-03-01: qty 1

## 2015-03-01 MED ORDER — OXYMETAZOLINE HCL 0.05 % NA SOLN: 1.0000 | Freq: Two times a day (BID) | NASAL | Status: DC

## 2015-03-01 MED ORDER — PROPOFOL 10 MG/ML IV BOLUS
INTRAVENOUS | Status: DC | PRN
  Administered 2015-03-01: 200 mg via INTRAVENOUS

## 2015-03-01 MED ORDER — STERILE WATER FOR INJECTION IJ SOLN
INTRAMUSCULAR | Status: AC
  Filled 2015-03-01: qty 10

## 2015-03-01 SURGICAL SUPPLY — 29 items
BUR CROSS CUT FISSURE 1.6 (BURR) ×2 IMPLANT
BUR CROSS CUT FISSURE 1.6MM (BURR) ×1
BUR EGG ELITE 4.0 (BURR) ×1 IMPLANT
BUR EGG ELITE 4.0MM (BURR) ×1
CANISTER SUCTION 2500CC (MISCELLANEOUS) ×3 IMPLANT
COVER SURGICAL LIGHT HANDLE (MISCELLANEOUS) ×3 IMPLANT
CRADLE DONUT ADULT HEAD (MISCELLANEOUS) ×3 IMPLANT
FLUID NSS /IRRIG 1000 ML XXX (MISCELLANEOUS) ×3 IMPLANT
GAUZE PACKING FOLDED 2  STR (GAUZE/BANDAGES/DRESSINGS) ×2
GAUZE PACKING FOLDED 2 STR (GAUZE/BANDAGES/DRESSINGS) ×1 IMPLANT
GLOVE BIO SURGEON STRL SZ 6.5 (GLOVE) ×2 IMPLANT
GLOVE BIO SURGEON STRL SZ7.5 (GLOVE) ×3 IMPLANT
GLOVE BIO SURGEONS STRL SZ 6.5 (GLOVE) ×1
GLOVE BIOGEL PI IND STRL 7.0 (GLOVE) ×1 IMPLANT
GLOVE BIOGEL PI INDICATOR 7.0 (GLOVE) ×2
GOWN STRL REUS W/ TWL LRG LVL3 (GOWN DISPOSABLE) ×1 IMPLANT
GOWN STRL REUS W/ TWL XL LVL3 (GOWN DISPOSABLE) ×1 IMPLANT
GOWN STRL REUS W/TWL LRG LVL3 (GOWN DISPOSABLE) ×3
GOWN STRL REUS W/TWL XL LVL3 (GOWN DISPOSABLE) ×3
KIT BASIN OR (CUSTOM PROCEDURE TRAY) ×3 IMPLANT
KIT ROOM TURNOVER OR (KITS) ×3 IMPLANT
NEEDLE 22X1 1/2 (OR ONLY) (NEEDLE) ×6 IMPLANT
NS IRRIG 1000ML POUR BTL (IV SOLUTION) ×3 IMPLANT
PAD ARMBOARD 7.5X6 YLW CONV (MISCELLANEOUS) ×3 IMPLANT
SUT CHROMIC 3 0 PS 2 (SUTURE) ×6 IMPLANT
SYR CONTROL 10ML LL (SYRINGE) ×3 IMPLANT
TRAY ENT MC OR (CUSTOM PROCEDURE TRAY) ×3 IMPLANT
TUBING IRRIGATION (MISCELLANEOUS) ×3 IMPLANT
YANKAUER SUCT BULB TIP NO VENT (SUCTIONS) ×3 IMPLANT

## 2015-03-01 NOTE — Anesthesia Procedure Notes (Signed)
Procedure Name: Intubation Date/Time: 03/01/2015 7:44 AM Performed by: Ollen Bowl Pre-anesthesia Checklist: Patient identified, Timeout performed, Emergency Drugs available, Suction available and Patient being monitored Patient Re-evaluated:Patient Re-evaluated prior to inductionOxygen Delivery Method: Circle system utilized and Simple face mask Preoxygenation: Pre-oxygenation with 100% oxygen Intubation Type: IV induction Ventilation: Mask ventilation with difficulty Laryngoscope Size: Mac and 4 Grade View: Grade II Nasal Tubes: Right, Nasal prep performed, Nasal Rae and Magill forceps- large, utilized Tube size: 7.5 mm Number of attempts: 2 Airway Equipment and Method: Patient positioned with wedge pillow Placement Confirmation: ETT inserted through vocal cords under direct vision,  positive ETCO2 and breath sounds checked- equal and bilateral Tube secured with: Tape Dental Injury: Bloody posterior oropharynx

## 2015-03-01 NOTE — Op Note (Signed)
03/01/2015  8:26 AM  PATIENT:  Jennefer Bravo  44 y.o. female  PRE-OPERATIVE DIAGNOSIS:  NON RESTORABLE TEETH #'s 2, 3, 4, 5, 6, 7, 8, 9, 10, 11, 12, 13, 14, 15, 20, 21, 22, 23, 24, 26, 27, 28, 29, 30, 32 POST-OPERATIVE DIAGNOSIS:  SAME  PROCEDURE:  Procedure(s): MULTIPLE EXTRACTIONS #'s 2, 3, 4, 5, 6, 7, 8, 9, 10, 11, 12, 13, 14, 15, 20, 21, 22, 23, 24, 26, 27, 28, 29, 30, 32;  ALVEOLOPLASTY  SURGEON:  Surgeon(s): Diona Browner, DDS  ANESTHESIA:   local and general  EBL:  minimal  DRAINS: none   SPECIMEN:  No Specimen  COUNTS:  YES  PLAN OF CARE: Discharge to home after PACU  PATIENT DISPOSITION:  PACU - hemodynamically stable.   PROCEDURE DETAILS: Dictation #   Gae Bon, DMD 03/01/2015 8:26 AM

## 2015-03-01 NOTE — Anesthesia Postprocedure Evaluation (Signed)
  Anesthesia Post-op Note  Patient: Jasmine Morrison  Procedure(s) Performed: Procedure(s): MULTIPLE EXTRACTIONS WITH ALVEOLOPLASTY (N/A)  Patient Location: PACU  Anesthesia Type:General  Level of Consciousness: awake and alert   Airway and Oxygen Therapy: Patient Spontanous Breathing  Post-op Pain: none  Post-op Assessment: Post-op Vital signs reviewed              Post-op Vital Signs: Reviewed  Last Vitals:  Filed Vitals:   03/01/15 1025  BP: 111/43  Pulse: 77  Temp:   Resp: 18    Complications: No apparent anesthesia complications

## 2015-03-01 NOTE — Progress Notes (Signed)
Pharmacy called to send Afrin ASAP as pt is a first case in the OR.

## 2015-03-01 NOTE — H&P (Signed)
H&P documentation  -History and Physical Reviewed  -Patient has been re-examined  -No change in the plan of care  Jasmine Morrison M  

## 2015-03-01 NOTE — Transfer of Care (Signed)
Immediate Anesthesia Transfer of Care Note  Patient: Jasmine Morrison  Procedure(s) Performed: Procedure(s): MULTIPLE EXTRACTIONS WITH ALVEOLOPLASTY (N/A)  Patient Location: PACU  Anesthesia Type:General  Level of Consciousness: awake and alert   Airway & Oxygen Therapy: Patient Spontanous Breathing and Patient connected to face mask oxygen  Post-op Assessment: Report given to RN and Post -op Vital signs reviewed and stable  Post vital signs: Reviewed and stable  Last Vitals:  Filed Vitals:   03/01/15 0837  BP: 95/68  Pulse: 89  Temp:   Resp: 17    Complications: No apparent anesthesia complications

## 2015-03-01 NOTE — Progress Notes (Signed)
Pt states she has not menstruated in 2 years.

## 2015-03-01 NOTE — Anesthesia Preprocedure Evaluation (Signed)
Anesthesia Evaluation  Patient identified by MRN, date of birth, ID band Patient awake    Reviewed: Allergy & Precautions, NPO status , Patient's Chart, lab work & pertinent test results  History of Anesthesia Complications Negative for: history of anesthetic complications  Airway Mallampati: II  TM Distance: >3 FB Neck ROM: Full    Dental  (+) Poor Dentition   Pulmonary Current Smoker,    Pulmonary exam normal       Cardiovascular hypertension, Pt. on medications and Pt. on home beta blockers Normal cardiovascular exam    Neuro/Psych  Headaches, PSYCHIATRIC DISORDERS Depression    GI/Hepatic Neg liver ROS, hiatal hernia,   Endo/Other  diabetes  Renal/GU negative Renal ROS     Musculoskeletal   Abdominal   Peds  Hematology   Anesthesia Other Findings   Reproductive/Obstetrics                             Anesthesia Physical Anesthesia Plan  ASA: III  Anesthesia Plan: General   Post-op Pain Management:    Induction: Intravenous  Airway Management Planned: Nasal ETT  Additional Equipment:   Intra-op Plan:   Post-operative Plan: Extubation in OR  Informed Consent: I have reviewed the patients History and Physical, chart, labs and discussed the procedure including the risks, benefits and alternatives for the proposed anesthesia with the patient or authorized representative who has indicated his/her understanding and acceptance.   Dental advisory given  Plan Discussed with: CRNA, Anesthesiologist and Surgeon  Anesthesia Plan Comments:         Anesthesia Quick Evaluation

## 2015-03-02 NOTE — Op Note (Signed)
NAME:  Jasmine Morrison, Jasmine Morrison               ACCOUNT NO.:  [id number]  MEDICAL RECORD NO.:  [id number]  LOCATION:  [facility]                         FACILITY:  [facility]  PHYSICIAN:  Gae Bon, M.D.  DATE OF BIRTH:  02-27-1971  DATE OF PROCEDURE:  03/01/2015 DATE OF DISCHARGE:                              OPERATIVE REPORT   PREOPERATIVE DIAGNOSIS:  Nonrestorable teeth numbers 2, 3, 4, 5, 6, 7, 8, 9, 10, 11, 12, 13, 14, 15, 20, 21, 22, 23, 24, 26, 27, 28, 29, 30, 32 secondary to dental caries.  POSTOPERATIVE DIAGNOSIS:  Nonrestorable teeth numbers 2, 3, 4, 5, 6, 7, 8, 9, 10, 11, 12, 13, 14, 15, 20, 21, 22, 23, 24, 26, 27, 28, 29, 30, 32 secondary to dental caries.  PROCEDURE:  Extraction of teeth numbers 2, 3, 4, 5, 6, 7, 8, 9, 10, 11, 12, 13, 14, 15, 20, 21, 22, 23, 24, 26, 27, 28, 29, 30, 32, alveoplasty, right and left maxilla and mandible.  SURGEON:  Gae Bon, MD  ANESTHESIA:  General, Dr. Migdalia Dk, attending.  PROCEDURE IN DETAIL:  The patient was taken to the operating room, placed on the table in supine position.  She was placed under general anesthesia intravenously and a nasal endotracheal tube was placed and secured.  The cuff was lacerated so the tube was replaced.  Then, the patient's eyes were protected and the patient was draped for the procedure.  Time-out was performed.  Posterior pharynx was suctioned, and a throat pack was placed.  A 2% lidocaine with 1:100,000 epinephrine was infiltrated in an inferior alveolar block on the right and left side and buccal and palatal infiltration of the maxilla.  Total of 17 mL was utilized.  A bite block was placed on the right side of the mouth.  A Sweetheart retractor was used to retract the tongue and the left side was operated first.  A #15 blade was used to make an incision both buccally and lingually around teeth numbers 20, 21, 22, 23, 24, in the mandible and around teeth #7, 8, 9, 10, 11, 12, 13, 14, and 15 in the maxilla.   The periosteum was reflected and the teeth were elevated and removed with a dental forceps.  Several teeth required elevation with a 301 elevator.  The sockets were then curetted.  The gingival tissue was trimmed to remove interdental papilla and then the periosteum was reflected to expose the alveolar crest.  Alveoplasty was then performed using the egg-shaped bur and bone file.  Then, the areas were irrigated and closed with 3-0 chromic.  The bite block and Sweetheart retractor were placed on the right side of the mouth to the left side and then a 15 blade was used to make an incision around teeth numbers 2, 3, 4, 5, and 6 in the maxilla and around teeth numbers 26, 27, 28, 29, 30, and 32 in the mandible.  The periosteum was reflected.  The teeth were elevated and removed with the forceps.  Then the sockets were curetted.  The periosteum was reflected after trimming the gingiva, and then alveoplasty was performed using the egg-shaped bur and bone file.  Then, the areas  were irrigated and closed with 3-0 chromic.  The oral cavity was then inspected, found to have good contour, hemostasis, and closure. The oral cavity was irrigated, suctioned.  Throat pack was removed.  The patient was awakened, taken to the recovery room, breathing spontaneously in good condition.  ESTIMATED BLOOD LOSS:  Minimal.  COMPLICATIONS:  None.  SPECIMENS:  None.     Gae Bon, M.D.     SMJ/MEDQ  D:  03/01/2015  T:  03/01/2015  Job:  [id number]

## 2015-03-03 ENCOUNTER — Encounter (HOSPITAL_COMMUNITY): Payer: Self-pay | Admitting: Oral Surgery

## 2015-03-20 NOTE — Patient Instructions (Signed)
Jasmine Morrison  03/20/2015     @PREFPERIOPPHARMACY @   Your procedure is scheduled on 03/26/2015.  Report to [facility] at 6:15 A.M.  Call this number if you have problems the morning of surgery:  [phone]   Remember:  Do not eat food or drink liquids after midnight.  Take these medicines the morning of surgery with A SIP OF WATER Buspar, Cymbalta, Lisinopril, Inderal, Oxycodone   Do not wear jewelry, make-up or nail polish.  Do not wear lotions, powders, or perfumes.  You may wear deodorant.  Do not shave 48 hours prior to surgery.  Men may shave face and neck.  Do not bring valuables to the hospital.  [facility] is not responsible for any belongings or valuables.  Contacts, dentures or bridgework may not be worn into surgery.  Leave your suitcase in the car.  After surgery it may be brought to your room.  For patients admitted to the hospital, discharge time will be determined by your treatment team.  Patients discharged the day of surgery will not be allowed to drive home.    Please read over the following fact sheets that you were given. Surgical Site Infection Prevention and Anesthesia Post-op Instructions     PATIENT INSTRUCTIONS POST-ANESTHESIA  IMMEDIATELY FOLLOWING SURGERY:  Do not drive or operate machinery for the first twenty four hours after surgery.  Do not make any important decisions for twenty four hours after surgery or while taking narcotic pain medications or sedatives.  If you develop intractable nausea and vomiting or a severe headache please notify your doctor immediately.  FOLLOW-UP:  Please make an appointment with your surgeon as instructed. You do not need to follow up with anesthesia unless specifically instructed to do so.  WOUND CARE INSTRUCTIONS (if applicable):  Keep a dry clean dressing on the anesthesia/puncture wound site if there is drainage.  Once the wound has quit draining you may leave it open to air.  Generally you should leave  the bandage intact for twenty four hours unless there is drainage.  If the epidural site drains for more than 36-48 hours please call the anesthesia department.  QUESTIONS?:  Please feel free to call your physician or the hospital operator if you have any questions, and they will be happy to assist you.      Laparoscopically Assisted Vaginal Hysterectomy  A laparoscopically assisted vaginal hysterectomy (LAVH) is a surgical procedure to remove the uterus and cervix, and sometimes the ovaries and fallopian tubes. During an LAVH, some of the surgical removal is done through the vagina, and the rest is done through a few small surgical cuts (incisions) in the abdomen.  This procedure is usually considered in women when a vaginal hysterectomy is not an option. Your health care provider will discuss the risks and benefits of the different surgical techniques at your appointment. Generally, recovery time is faster and there are fewer complications after laparoscopic procedures than after open incisional procedures. LET [facility] CARE PROVIDER KNOW ABOUT:   Any allergies you have.  All medicines you are taking, including vitamins, herbs, eye drops, creams, and over-the-counter medicines.  Previous problems you or members of your family have had with the use of anesthetics.  Any blood disorders you have.  Previous surgeries you have had.  Medical conditions you have. RISKS AND COMPLICATIONS Generally, this is a safe procedure. However, as with any procedure, complications can occur. Possible complications include:  Allergies to medicines.  Difficulty breathing.  Bleeding.  Infection.  Damage to other structures near your uterus and cervix. BEFORE THE PROCEDURE  Ask your health care provider about changing or stopping your regular medicines.  Take certain medicines, such as a colon-emptying preparation, as directed.  Do not eat or drink anything for at least 8 hours before your  surgery.  Stop smoking if you smoke. Stopping will improve your health after surgery.  Arrange for a ride home after surgery and for help at home during recovery. PROCEDURE   An IV tube will be put into one of your veins in order to give you fluids and medicines.  You will receive medicines to relax you and medicines that make you sleep (general anesthetic).  You may have a flexible tube (catheter) put into your bladder to drain urine.  You may have a tube put through your nose or mouth that goes into your stomach (nasogastric tube). The nasogastric tube removes digestive fluids and prevents you from feeling nauseated and from vomiting.  Tight-fitting (compression) stockings will be placed on your legs to promote circulation.  Three to four small incisions will be made in your abdomen. An incision also will be made in your vagina. Probes and tools will be inserted into the small incisions. The uterus and cervix are removed (and possibly your ovaries and fallopian tubes) through your vagina as well as through the small incisions that were made in the abdomen.  Your vagina is then sewn back to normal. AFTER THE PROCEDURE  You may have a liquid diet temporarily. You will most likely return to, and tolerate, your usual diet the day after surgery.  You will be passing urine through a catheter. It will be removed the day after surgery.  Your temperature, breathing rate, heart rate, blood pressure, and oxygen level will be monitored regularly.  You will still wear compression stockings on your legs until you are able to move around.  You will use a special device or do breathing exercises to keep your lungs clear.  You will be encouraged to walk as soon as possible. Document Released: 06/18/2011 Document Revised: 03/01/2013 Document Reviewed: 01/12/2013 [facility] Patient Information 2015 [place], [place]. This information is not intended to replace advice given to you by your health care  provider. Make sure you discuss any questions you have with your health care provider.

## 2015-03-21 ENCOUNTER — Encounter (HOSPITAL_COMMUNITY)
Admission: RE | Admit: 2015-03-21 | Discharge: 2015-03-21 | Disposition: A | Discharge status: Home / Self Care | Payer: Medicaid Other | Source: Ambulatory Visit | Attending: Obstetrics and Gynecology | Admitting: Obstetrics and Gynecology

## 2015-03-26 ENCOUNTER — Encounter (HOSPITAL_COMMUNITY): Admission: RE | Payer: Self-pay | Source: Ambulatory Visit

## 2015-03-26 ENCOUNTER — Ambulatory Visit (HOSPITAL_COMMUNITY): Admission: RE | Admit: 2015-03-26 | Payer: MEDICAID | Source: Ambulatory Visit | Admitting: Obstetrics and Gynecology

## 2015-03-26 SURGERY — HYSTERECTOMY, VAGINAL
Anesthesia: Choice

## 2015-04-02 ENCOUNTER — Encounter: Payer: Medicaid Other | Admitting: Adult Health

## 2015-04-22 ENCOUNTER — Ambulatory Visit (INDEPENDENT_AMBULATORY_CARE_PROVIDER_SITE_OTHER): Payer: Medicaid Other | Admitting: Obstetrics and Gynecology

## 2015-04-22 ENCOUNTER — Other Ambulatory Visit (HOSPITAL_COMMUNITY): Payer: Self-pay | Admitting: *Deleted

## 2015-04-22 ENCOUNTER — Encounter: Payer: Self-pay | Admitting: Obstetrics and Gynecology

## 2015-04-22 VITALS — BP 100/60 | Ht 63.0 in | Wt 241.0 lb

## 2015-04-22 DIAGNOSIS — Z09 Encounter for follow-up examination after completed treatment for conditions other than malignant neoplasm: Secondary | ICD-10-CM

## 2015-04-22 DIAGNOSIS — N871 Moderate cervical dysplasia: Secondary | ICD-10-CM
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
## 2015-05-07 ENCOUNTER — Ambulatory Visit (HOSPITAL_COMMUNITY)
Admission: RE | Admit: 2015-05-07 | Discharge: 2015-05-07 | Disposition: A | Discharge status: Home / Self Care | Payer: Medicaid Other | Source: Ambulatory Visit | Attending: *Deleted | Admitting: *Deleted

## 2015-05-07 ENCOUNTER — Other Ambulatory Visit (HOSPITAL_COMMUNITY): Payer: Self-pay | Admitting: *Deleted

## 2015-05-07 DIAGNOSIS — N6011 Diffuse cystic mastopathy of right breast: Secondary | ICD-10-CM

## 2015-05-07 DIAGNOSIS — Z09 Encounter for follow-up examination after completed treatment for conditions other than malignant neoplasm: Secondary | ICD-10-CM

## 2015-05-07 DIAGNOSIS — N63 Unspecified lump in breast: Secondary | ICD-10-CM | POA: Insufficient documentation

## 2015-05-14 ENCOUNTER — Ambulatory Visit (INDEPENDENT_AMBULATORY_CARE_PROVIDER_SITE_OTHER): Payer: Medicaid Other | Admitting: Obstetrics and Gynecology

## 2015-05-14 ENCOUNTER — Other Ambulatory Visit: Payer: Self-pay | Admitting: Obstetrics and Gynecology

## 2015-05-14 ENCOUNTER — Encounter: Payer: Self-pay | Admitting: Obstetrics and Gynecology

## 2015-05-14 VITALS — BP 100/58 | Ht 63.0 in | Wt 239.5 lb

## 2015-05-14 DIAGNOSIS — R87613 High grade squamous intraepithelial lesion on cytologic smear of cervix (HGSIL): Secondary | ICD-10-CM

## 2015-05-14 DIAGNOSIS — Z3202 Encounter for pregnancy test, result negative: Secondary | ICD-10-CM

## 2015-05-14 DIAGNOSIS — N871 Moderate cervical dysplasia: Secondary | ICD-10-CM | POA: Diagnosis not present

## 2015-05-14 DIAGNOSIS — Z3200 Encounter for pregnancy test, result unknown: Secondary | ICD-10-CM

## 2015-05-14 LAB — POCT URINE PREGNANCY: Preg Test, Ur: NEGATIVE

## 2015-05-17 NOTE — Progress Notes (Signed)
Patient ID: Jasmine Morrison, female   DOB: [birth year], 44 y.o.   MRN: [id number]  GYNECOLOGY CLINIC PROCEDURE NOTE:  LEEP done to rule out endocervical margin residual CIN.   Pt is s/p CKC with Pathology showing aparent involvement of endocervical margin of a large CKC specimen, noted at 3 oclock.    [name] is a 44 y.o. No obstetric history on file. here for LEEP. No GYN concerns. Pap smear and colposcopy reviewed.  Results as noted above   Risks, benefits, alternatives, and limitations of procedure explained to patient, including pain, bleeding, infection, failure to remove abnormal tissue and failure to cure dysplasia, need for repeat procedures, damage to pelvic organs, cervical incompetence.  Role of HPV,cervical dysplasia and need for close followup was empasized. Informed written consent was obtained. All questions were answered. Time out performed.  Procedure: The patient was placed in lithotomy position and the bivalved coated speculum was placed in the patient's vagina. A grounding pad placed on the patient . Lugol's solution was applied to the cervix and the exocervix all took up Lugols. The endocervix was rather tiny. No area of absent uptake was noted   Local anesthesia was administered via an intracervical block using 10cc of 2% Lidocaine with epinephrine. The suction was turned on and the ; medium 9 mm wide Cone Biopsy Excisor on 73 Watts of cutting current was used to excise the area of decreased uptake and excise the entire transformation zone. A DEEPER endocervical button of tissue was excised and sent with the cone specimen.  Excellent hemostasis was achieved by use of Monsel's solution  Which was then applied and the speculum was removed from the vagina. Specimens were sent to pathology.  The patient tolerated the procedure well. Post-operative instructions given to patient, including instruction to seek medical attention for persistent bright red bleeding, fever,  abdominal/pelvic pain, dysuria, nausea or vomiting. She was also told about the possibility of having copious yellow to black tinged discharge for weeks. She was counseled to avoid anything in the vagina (sex/douching/tampons) for 3 weeks. She has a 4 week post-operative check to assess wound healing, review results and discuss further management.    Addendum : Pathology report has returned with report indicating NO dysplasia, "benign squamous epithelium".  jvf 05/17/15

## 2015-06-04 ENCOUNTER — Ambulatory Visit (INDEPENDENT_AMBULATORY_CARE_PROVIDER_SITE_OTHER): Payer: Medicaid Other | Admitting: Obstetrics and Gynecology

## 2015-06-04 ENCOUNTER — Encounter: Payer: Self-pay | Admitting: Obstetrics and Gynecology

## 2015-06-04 VITALS — BP 110/72 | Ht 63.0 in | Wt 239.0 lb

## 2015-06-04 DIAGNOSIS — Z09 Encounter for follow-up examination after completed treatment for conditions other than malignant neoplasm: Secondary | ICD-10-CM

## 2015-06-04 NOTE — Progress Notes (Signed)
Patient ID: Jasmine Morrison, female   DOB: [birth year], 44 y.o.   MRN: [id number] Pt here today for follow up visit. Pt states that she has pain and cramping since she had the LEEP. Pt states that she had some discharge for a liuttle period after the LEEP but denies any odor.
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
## 2015-08-05 ENCOUNTER — Other Ambulatory Visit: Payer: Medicaid Other | Admitting: Obstetrics and Gynecology

## 2015-08-08 ENCOUNTER — Encounter: Payer: Medicaid Other | Admitting: Neurology

## 2015-09-24 ENCOUNTER — Other Ambulatory Visit (HOSPITAL_COMMUNITY): Payer: Self-pay | Admitting: *Deleted

## 2015-09-24 DIAGNOSIS — Z1231 Encounter for screening mammogram for malignant neoplasm of breast: Secondary | ICD-10-CM

## 2015-10-07 ENCOUNTER — Ambulatory Visit (HOSPITAL_COMMUNITY): Payer: Medicaid Other

## 2015-10-23 ENCOUNTER — Encounter: Payer: Self-pay | Admitting: Obstetrics and Gynecology

## 2015-10-23 ENCOUNTER — Ambulatory Visit (HOSPITAL_COMMUNITY)
Admission: RE | Admit: 2015-10-23 | Discharge: 2015-10-23 | Disposition: A | Discharge status: Home / Self Care | Payer: PRIVATE HEALTH INSURANCE | Source: Ambulatory Visit | Attending: *Deleted | Admitting: *Deleted

## 2015-10-23 DIAGNOSIS — Z1231 Encounter for screening mammogram for malignant neoplasm of breast: Secondary | ICD-10-CM | POA: Diagnosis not present

## 2016-01-24 IMAGING — US US BREAST COMPLETE UNI RIGHT INC AXILLA
1 series · 5 of 5 positions shown · non-contrast
Comparison: Previous exam(s).

CLINICAL DATA: Six-month follow-up evaluation of probably benign
right breast mass.

EXAM:
DIGITAL DIAGNOSTIC RIGHT MAMMOGRAM WITH 3D TOMOSYNTHESIS AND CAD
RIGHT BREAST ULTRASOUND

[Series 1: us breast complete uni right inc axilla · 0.07mm/px · 5 of 5 slices shown]
[im 1/5]
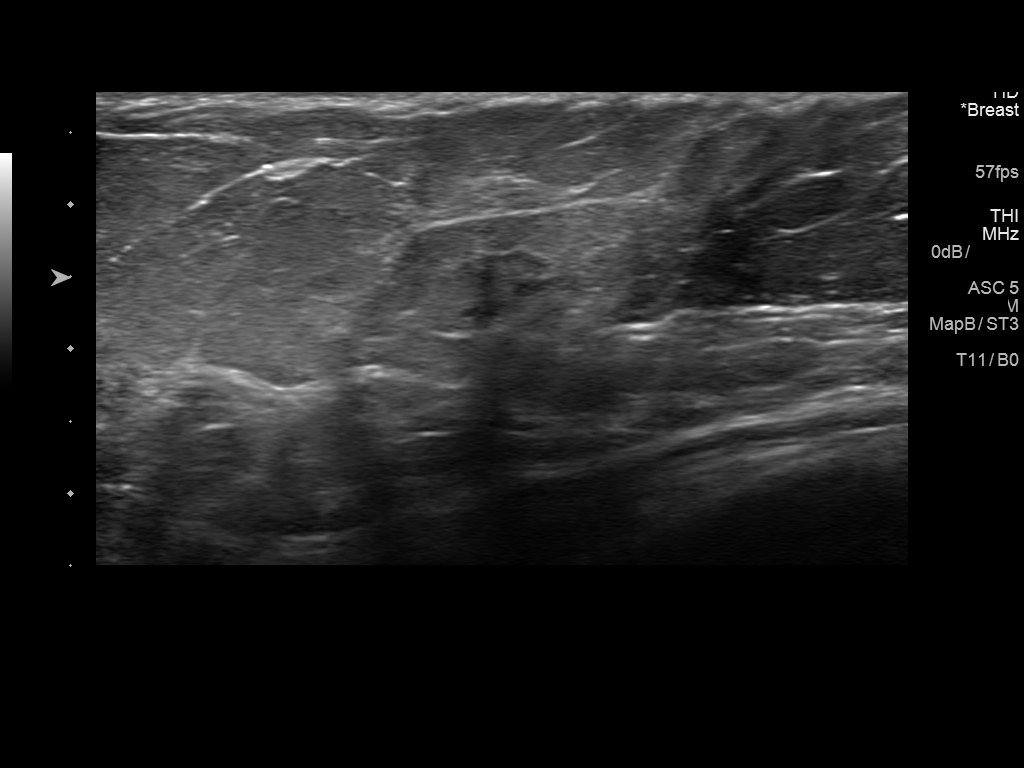
[im 2/5]
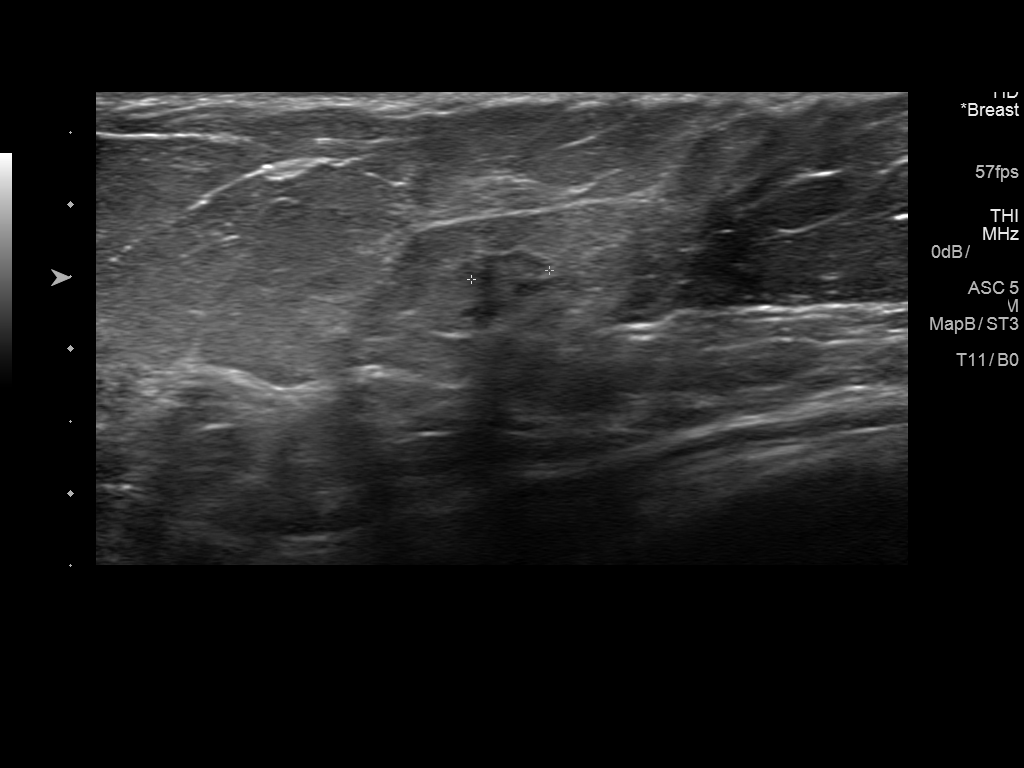
[im 3/5]
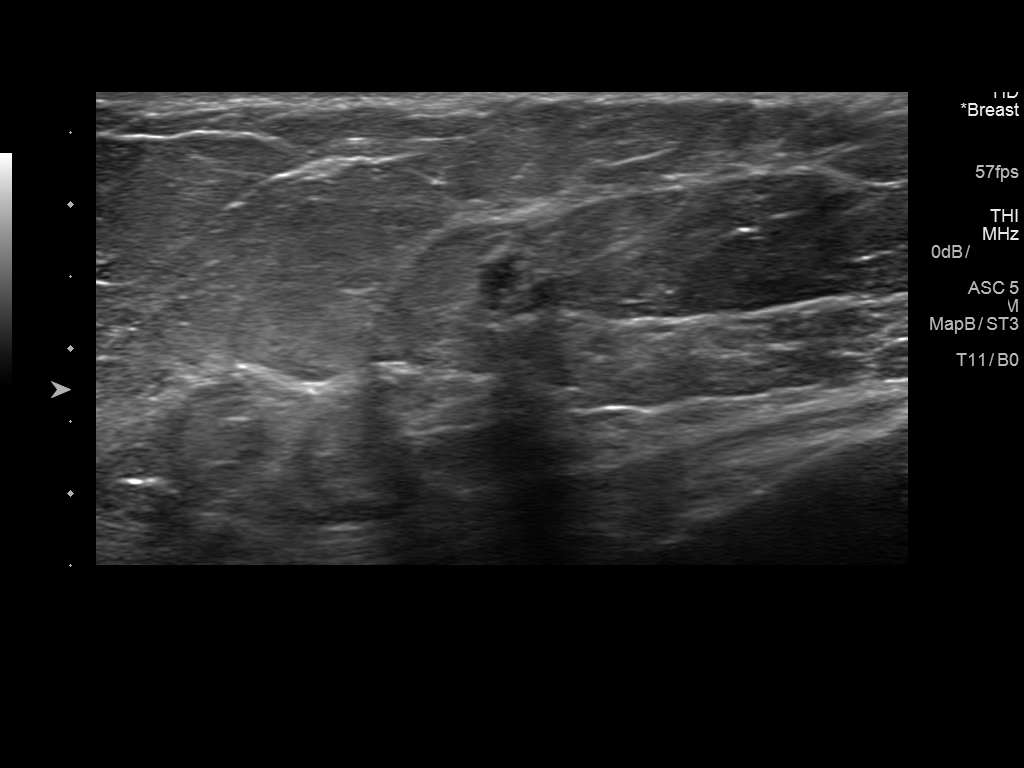
[im 4/5]
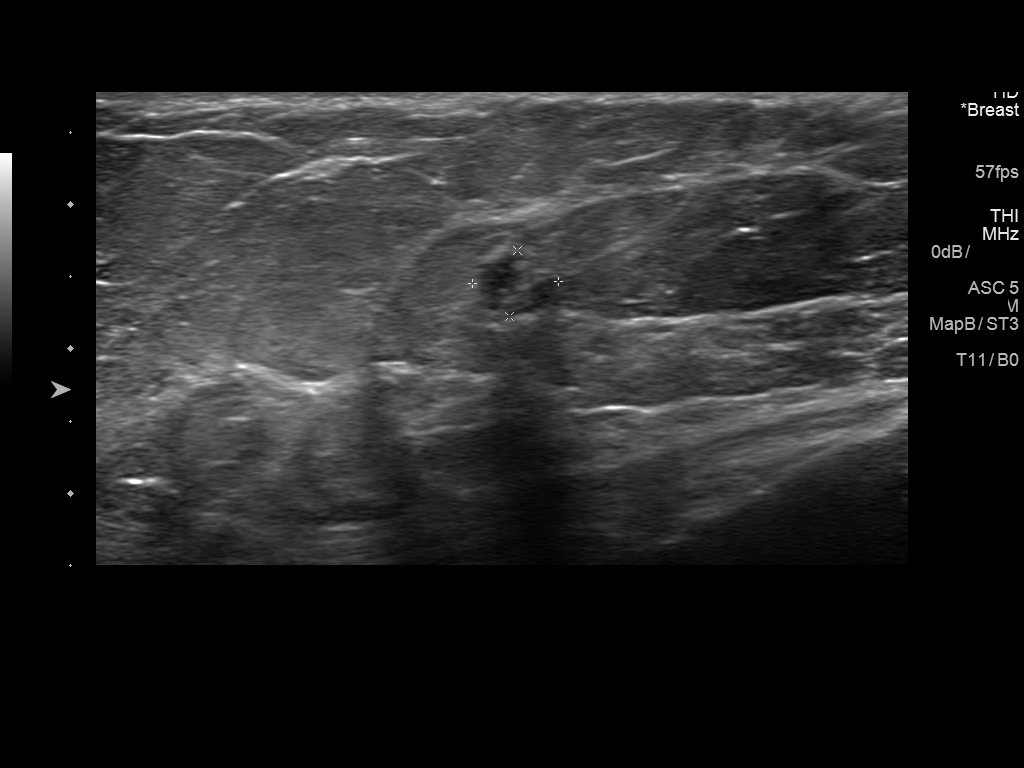
[im 5/5]
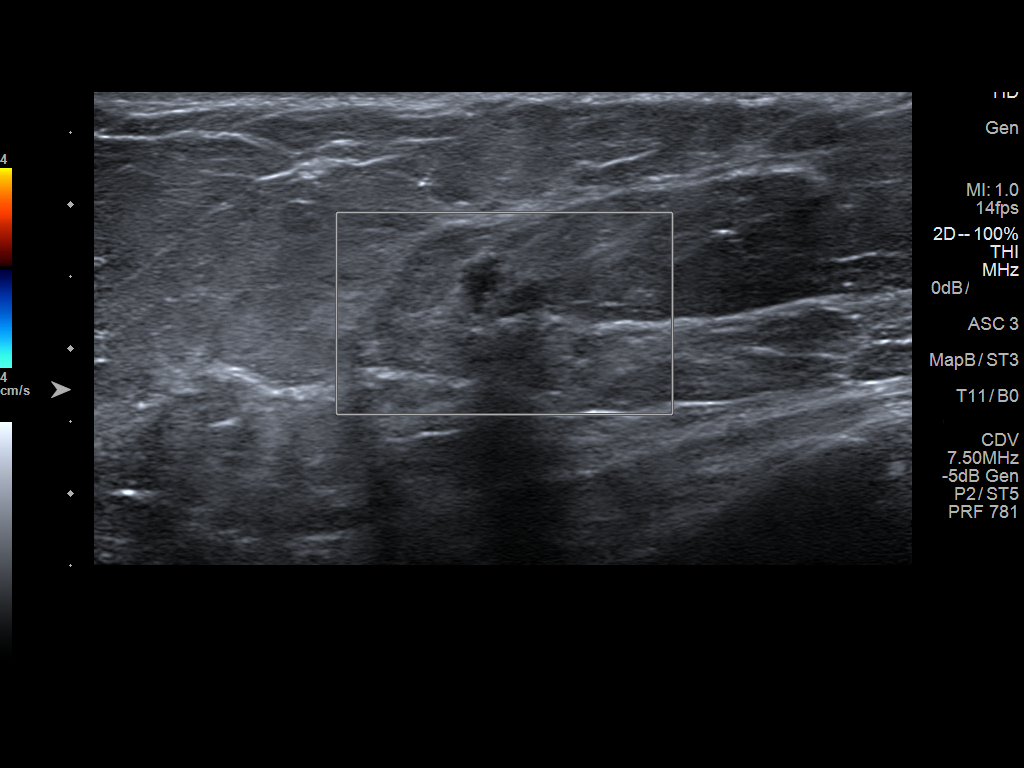

[5 of 5 positions shown; findings below may reference images not displayed]

ACR Breast Density Category b: There are scattered areas of
fibroglandular density.
FINDINGS: The probably benign mass in the lower slightly inner quadrant of the
right breast appears less prominent than on the comparison exam.

Mammographic images were processed with CAD.

Ultrasound targeted to the inferior right breast at 5 o'clock, 6 cm
from the nipple demonstrates decrease in size of the small
hypoechoic mass, now measuring 0.6 x 0.5 x 0.5 cm, stable from
prior. This has demonstrated slow decrease in size over time. These
findings are consistent with a benign mass.
IMPRESSION: Gradual decrease in size of the right breast mass at 5 o'clock,
likely representing fibrocystic change.

RECOMMENDATION:
Screening mammogram in one year.(Code:69-E-IUY)

I have discussed the findings and recommendations with the patient.
Results were also provided in writing at the conclusion of the
visit. If applicable, a reminder letter will be sent to the patient
regarding the next appointment.

BI-RADS CATEGORY  2: Benign.

## 2016-03-29 ENCOUNTER — Emergency Department (HOSPITAL_COMMUNITY)
Admission: EM | Admit: 2016-03-29 | Discharge: 2016-03-29 | Disposition: A | Discharge status: Home / Self Care | Payer: Medicaid Other | Attending: Emergency Medicine | Admitting: Emergency Medicine

## 2016-03-29 ENCOUNTER — Encounter (HOSPITAL_COMMUNITY): Payer: Self-pay | Admitting: Emergency Medicine

## 2016-03-29 DIAGNOSIS — L089 Local infection of the skin and subcutaneous tissue, unspecified: Secondary | ICD-10-CM

## 2016-03-29 DIAGNOSIS — Z7984 Long term (current) use of oral hypoglycemic drugs: Secondary | ICD-10-CM | POA: Insufficient documentation

## 2016-03-29 DIAGNOSIS — L723 Sebaceous cyst: Secondary | ICD-10-CM | POA: Insufficient documentation

## 2016-03-29 DIAGNOSIS — Z79899 Other long term (current) drug therapy: Secondary | ICD-10-CM | POA: Insufficient documentation

## 2016-03-29 DIAGNOSIS — I10 Essential (primary) hypertension: Secondary | ICD-10-CM | POA: Insufficient documentation

## 2016-03-29 DIAGNOSIS — E119 Type 2 diabetes mellitus without complications: Secondary | ICD-10-CM | POA: Insufficient documentation

## 2016-03-29 DIAGNOSIS — Z8541 Personal history of malignant neoplasm of cervix uteri: Secondary | ICD-10-CM | POA: Insufficient documentation

## 2016-03-29 DIAGNOSIS — F17210 Nicotine dependence, cigarettes, uncomplicated: Secondary | ICD-10-CM | POA: Insufficient documentation

## 2016-03-29 MED ORDER — TRAMADOL HCL 50 MG PO TABS: 50.0000 mg | ORAL_TABLET | Freq: Four times a day (QID) | ORAL | 0 refills | Status: DC | PRN

## 2016-03-29 MED ORDER — LIDOCAINE HCL (PF) 1 % IJ SOLN
5.0000 mL | Freq: Once | INTRAMUSCULAR | Status: AC
  Administered 2016-03-29: 5 mL
  Filled 2016-03-29: qty 5

## 2016-03-29 MED ORDER — SULFAMETHOXAZOLE-TRIMETHOPRIM 800-160 MG PO TABS: 1.0000 | ORAL_TABLET | Freq: Two times a day (BID) | ORAL | 0 refills | Status: AC
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
## 2016-03-29 NOTE — ED Triage Notes (Signed)
Patient c/o abscess to upper back. Per patient abscess x2 months but recently increased in size and started to drain. Patient has neosporin and bandage over area. Patient states she believes she has had a fever but is unsure.

## 2016-04-20 IMAGING — CR DG TOE 3RD 2+V*R*
3 series · 3 of 3 positions shown · non-contrast
Comparison: None.

CLINICAL DATA: 43-year-old female with open wound. Possible
osteomyelitis. Initial encounter.

EXAM:
RIGHT THIRD TOE

[t toes ap right]
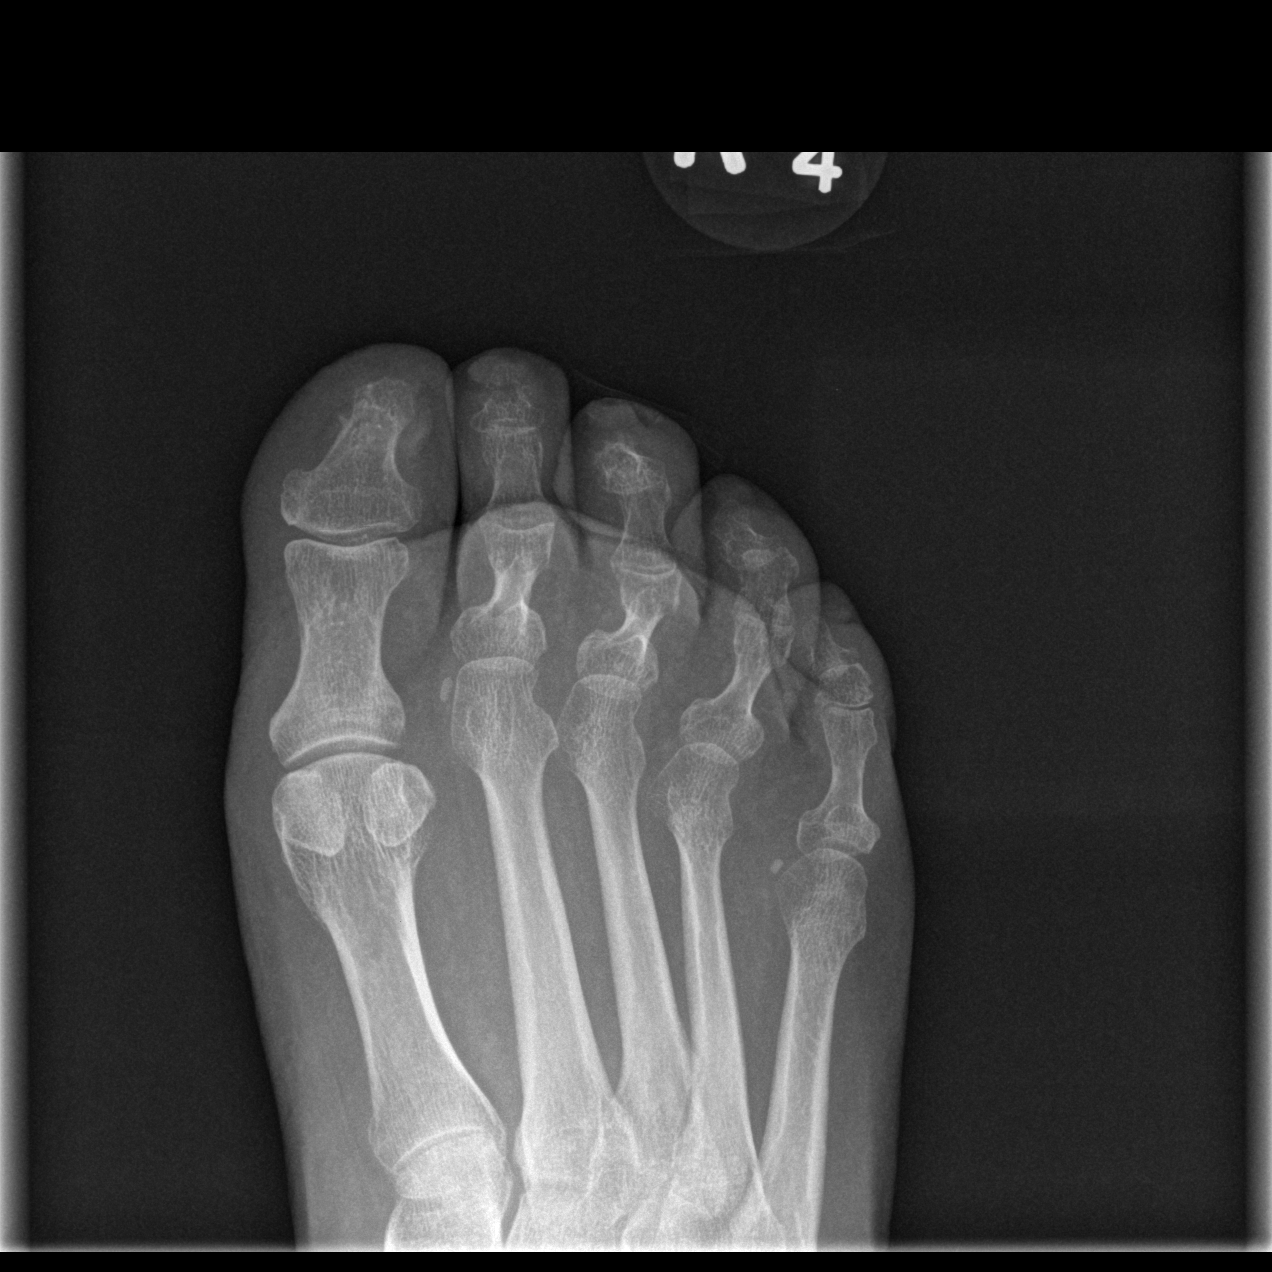

[t toes oblique right]
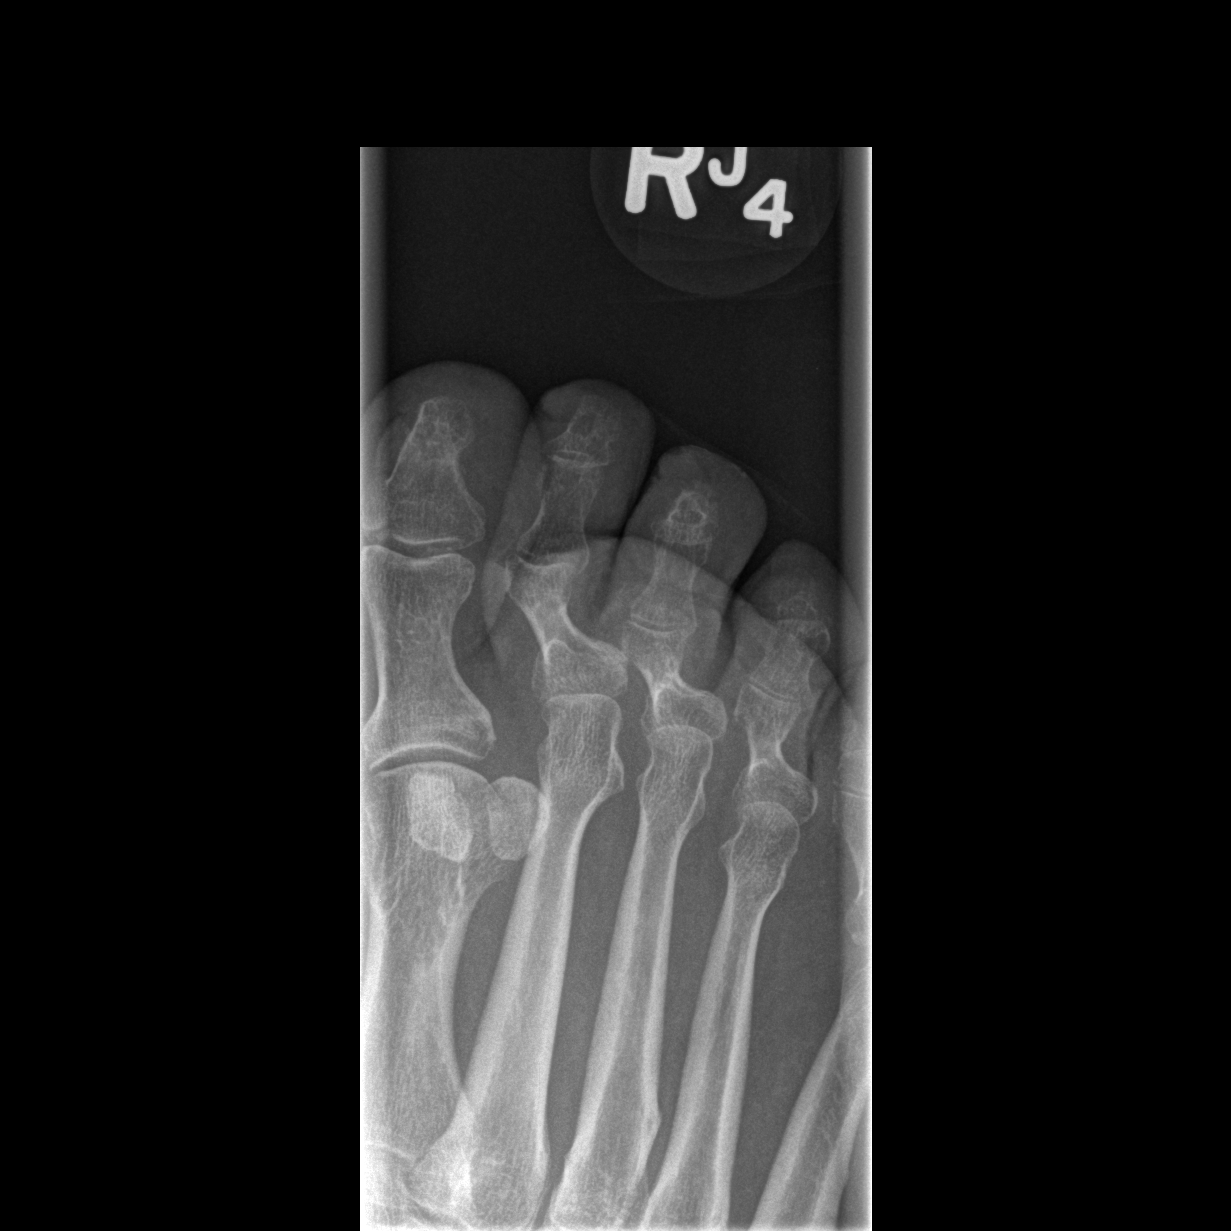

[t toes lateral right]
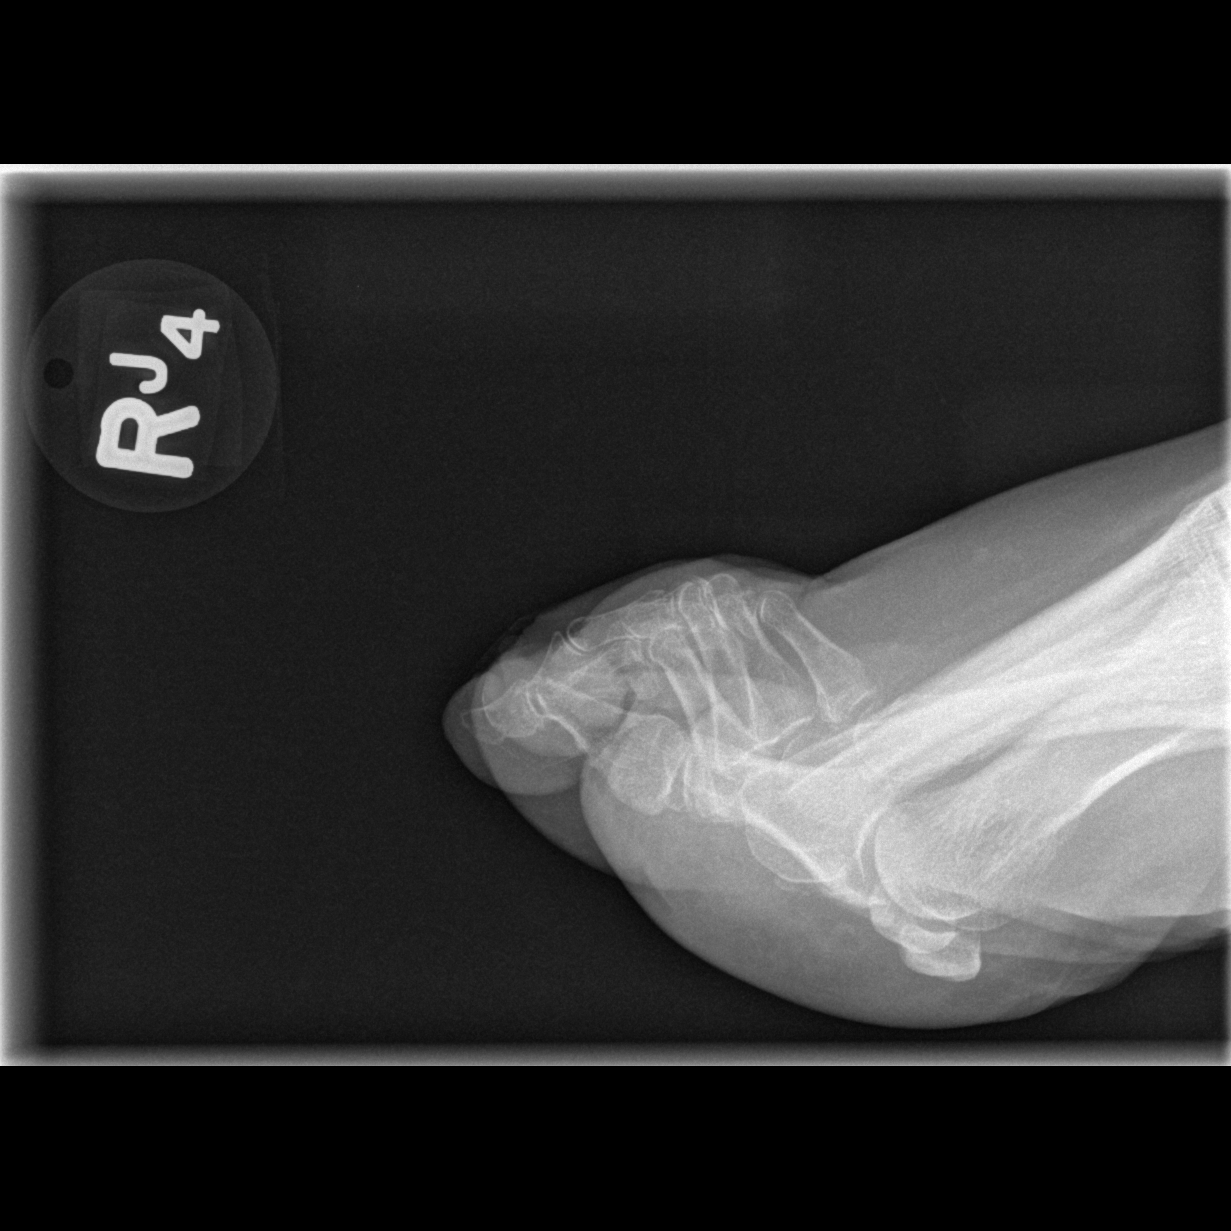

[3 of 3 positions shown; findings below may reference images not displayed]

FINDINGS: No previous study for comparison. No discrete soft tissue wound
identified.

Osteopenia in the distal phalanges throughout the right foot. No
subcutaneous gas. No radiopaque foreign body identified. Loss of
cortical detail at the tuft of the fourth distal phalanx suggesting
osteolysis. Question more subtle involvement of the seconds and
third [REDACTED]. Joint spaces appear preserved. No other cortical
osteolysis identified.
IMPRESSION: The location of the open wound is not evident, but there is
suggestion of osteomyelitis involving the tuft of the fourth distal
phalanx. More subtle changes in this second and third distal
phalanges. MRI of the foot would be more sensitive and specific if
the diagnosis remains in doubt.

## 2016-12-17 ENCOUNTER — Other Ambulatory Visit (HOSPITAL_COMMUNITY): Payer: Self-pay | Admitting: *Deleted

## 2016-12-17 DIAGNOSIS — Z1231 Encounter for screening mammogram for malignant neoplasm of breast: Secondary | ICD-10-CM

## 2016-12-30 ENCOUNTER — Ambulatory Visit (HOSPITAL_COMMUNITY)
Admission: RE | Admit: 2016-12-30 | Discharge: 2016-12-30 | Disposition: A | Discharge status: Home / Self Care | Payer: PRIVATE HEALTH INSURANCE | Source: Ambulatory Visit | Attending: *Deleted | Admitting: *Deleted

## 2016-12-30 DIAGNOSIS — Z1231 Encounter for screening mammogram for malignant neoplasm of breast: Secondary | ICD-10-CM | POA: Diagnosis present

## 2022-09-26 ENCOUNTER — Emergency Department (HOSPITAL_COMMUNITY)
Admission: EM | Admit: 2022-09-26 | Discharge: 2022-09-27 | Disposition: A | Discharge status: Home / Self Care | Payer: Self-pay | Attending: Emergency Medicine | Admitting: Emergency Medicine

## 2022-09-26 ENCOUNTER — Other Ambulatory Visit: Payer: Self-pay

## 2022-09-26 DIAGNOSIS — E1140 Type 2 diabetes mellitus with diabetic neuropathy, unspecified: Secondary | ICD-10-CM | POA: Insufficient documentation

## 2022-09-26 DIAGNOSIS — W010XXA Fall on same level from slipping, tripping and stumbling without subsequent striking against object, initial encounter: Secondary | ICD-10-CM | POA: Insufficient documentation

## 2022-09-26 DIAGNOSIS — Z8541 Personal history of malignant neoplasm of cervix uteri: Secondary | ICD-10-CM | POA: Insufficient documentation

## 2022-09-26 DIAGNOSIS — Z7984 Long term (current) use of oral hypoglycemic drugs: Secondary | ICD-10-CM | POA: Insufficient documentation

## 2022-09-26 DIAGNOSIS — Z79899 Other long term (current) drug therapy: Secondary | ICD-10-CM | POA: Insufficient documentation

## 2022-09-26 DIAGNOSIS — S82142A Displaced bicondylar fracture of left tibia, initial encounter for closed fracture: Secondary | ICD-10-CM | POA: Insufficient documentation

## 2022-09-26 DIAGNOSIS — I10 Essential (primary) hypertension: Secondary | ICD-10-CM | POA: Insufficient documentation

## 2022-09-26 NOTE — ED Triage Notes (Addendum)
Pt slipped and fell this morning in a puddle of liquid. Reports knee pain and difficulty ambulating since fall. C/o L knee and L ankle pain, swelling noted, tender to palpation

## 2022-09-27 ENCOUNTER — Emergency Department (HOSPITAL_COMMUNITY): Payer: Self-pay

## 2022-09-27 MED ORDER — HYDROCODONE-ACETAMINOPHEN 5-325 MG PO TABS
1.0000 | ORAL_TABLET | Freq: Once | ORAL | Status: AC
  Administered 2022-09-27: 1 via ORAL
  Filled 2022-09-27: qty 1

## 2022-09-27 MED ORDER — OXYCODONE-ACETAMINOPHEN 5-325 MG PO TABS: 1.0000 | ORAL_TABLET | Freq: Four times a day (QID) | ORAL | 0 refills | Status: DC | PRN

## 2022-09-27 NOTE — ED Provider Notes (Signed)
[facility] Provider Note   CSN: [id number] Arrival date & time: 09/26/22  2332     History  Chief Complaint  Patient presents with   Knee Pain    Jasmine Morrison is a 52 y.o. female.  HPI     This is a 52 year old female who presents following a fall.  Patient reports that she slipped and fell on a puddle this morning over 12 hours ago.  She has had progressively worsening left knee pain and swelling.  She reports pain with ambulation and increasing difficulty with ambulation.  Did not hit her head or lose consciousness.  Is not on any anticoagulants.  Home Medications Prior to Admission medications   Medication Sig Start Date End Date Taking? Authorizing Provider  oxyCODONE-acetaminophen (PERCOCET/ROXICET) 5-325 MG tablet Take 1 tablet by mouth every 6 (six) hours as needed for severe pain. 09/27/22  Yes Xolani Degracia, Barbette Hair, MD  busPIRone (BUSPAR) 5 MG tablet Take 5 mg by mouth 2 (two) times daily.    [provider]  CINNAMON PO Take 1 tablet by mouth daily.    [provider]  DULoxetine (CYMBALTA) 30 MG capsule Take 30 mg by mouth daily.    [provider]  DULoxetine (CYMBALTA) 60 MG capsule Take 60 mg by mouth daily.    [provider]  gabapentin (NEURONTIN) 300 MG capsule Take 300 mg by mouth 2 (two) times daily.    [provider]  lisinopril (PRINIVIL,ZESTRIL) 5 MG tablet Take 5 mg by mouth daily.    [provider]  metFORMIN (GLUCOPHAGE) 500 MG tablet Take 500 mg by mouth 2 (two) times daily with a meal.    [provider]  Multiple Vitamin (MULTIVITAMIN) tablet Take 1 tablet by mouth daily.    [provider]  Multiple Vitamins-Minerals (ECHINACEA ACZ PO) Take 1 tablet by mouth daily.    [provider]  Omega-3 Fatty Acids (FISH OIL PO) Take 1 capsule by mouth daily.    [provider]  propranolol (INDERAL) 20 MG tablet Take 20 mg by  mouth 2 (two) times daily.    [provider]  traMADol (ULTRAM) 50 MG tablet Take 1 tablet (50 mg total) by mouth every 6 (six) hours as needed. 03/29/16   [name], PA-C  traZODone (DESYREL) 100 MG tablet Take 100 mg by mouth at bedtime.    [provider]  VITAMIN E PO Take 1 tablet by mouth daily.    [provider]      Allergies    Buspar [buspirone]    Review of Systems   Review of Systems  Musculoskeletal:        Left knee pain  All other systems reviewed and are negative.   Physical Exam Updated Vital Signs BP 126/79   Pulse 96   Temp 98.5 F (36.9 C)   Resp 20   Ht 1.626 m (5\' 4" )   Wt 95.3 kg   LMP 01/21/2013   SpO2 94%   BMI 36.05 kg/m  Physical Exam Vitals and nursing note reviewed.  Constitutional:      Appearance: She is well-developed. She is obese. She is not ill-appearing.  HENT:     Head: Normocephalic and atraumatic.  Eyes:     Pupils: Pupils are equal, round, and reactive to light.  Cardiovascular:     Rate and Rhythm: Normal rate and regular rhythm.  Pulmonary:     Effort: Pulmonary effort  is normal. No respiratory distress.  Abdominal:     Palpations: Abdomen is soft.  Musculoskeletal:     Cervical back: Neck supple.     Comments: Tenderness to palpation lower neural joint line of the left knee with obvious suprapatellar effusion, no overlying skin changes, warmth, erythema, limited range of motion secondary to pain, tenderness to palpation mid distal tibia with some contusion noted, neurovascular intact distally  Skin:    General: Skin is warm and dry.  Neurological:     Mental Status: She is alert and oriented to person, place, and time.     ED Results / Procedures / Treatments   Labs (all labs ordered are listed, but only abnormal results are displayed) Labs Reviewed - No data to display  EKG None  Radiology CT Knee Left Wo Contrast  Result Date: 09/27/2022 CLINICAL DATA:  Tibial plateau fracture.   Fall. EXAM: CT OF THE LEFT KNEE WITHOUT CONTRAST TECHNIQUE: Multidetector CT imaging of the left knee was performed according to the standard protocol. Multiplanar CT image reconstructions were also generated. RADIATION DOSE REDUCTION: This exam was performed according to the departmental dose-optimization program which includes automated exposure control, adjustment of the mA and/or kV according to patient size and/or use of iterative reconstruction technique. COMPARISON:  Knee series today FINDINGS: Bones/Joint/Cartilage Minimally depressed mildly comminuted fracture noted through the lateral tibial plateau. No additional fracture. No subluxation or dislocation. Ligaments Suboptimally assessed by CT. Muscles and Tendons Grossly unremarkable Soft tissues Moderate joint effusion IMPRESSION: Minimally depressed mildly comminuted lateral tibial plateau fracture. Electronically Signed   By: Rolm Baptise M.D.   On: 09/27/2022 01:31   DG Knee Complete 4 Views Left  Result Date: 09/27/2022 CLINICAL DATA:  Fall EXAM: LEFT KNEE - COMPLETE 4+ VIEW; LEFT TIBIA AND FIBULA - 2 VIEW COMPARISON:  None Available. FINDINGS: There is a minimally displaced fracture of the lateral tibial plateau. Small suprapatellar lipohemarthrosis. No dislocation at the knee. No distal tibia or fibula fracture. IMPRESSION: Minimally displaced fracture of the lateral tibial plateau. Electronically Signed   By: Ulyses Jarred M.D.   On: 09/27/2022 00:51   DG Tibia/Fibula Left  Result Date: 09/27/2022 CLINICAL DATA:  Fall EXAM: LEFT KNEE - COMPLETE 4+ VIEW; LEFT TIBIA AND FIBULA - 2 VIEW COMPARISON:  None Available. FINDINGS: There is a minimally displaced fracture of the lateral tibial plateau. Small suprapatellar lipohemarthrosis. No dislocation at the knee. No distal tibia or fibula fracture. IMPRESSION: Minimally displaced fracture of the lateral tibial plateau. Electronically Signed   By: Ulyses Jarred M.D.   On: 09/27/2022 00:51     Procedures Procedures    Medications Ordered in ED Medications  HYDROcodone-acetaminophen (NORCO/VICODIN) 5-325 MG per tablet 1 tablet (1 tablet Oral Given 09/27/22 0019)  HYDROcodone-acetaminophen (NORCO/VICODIN) 5-325 MG per tablet 1 tablet (1 tablet Oral Given 09/27/22 0108)    ED Course/ Medical Decision Making/ A&P                             Medical Decision Making Amount and/or Complexity of Data Reviewed Radiology: ordered.  Risk Prescription drug management.   This patient presents to the ED for concern of leg pain , this involves an extensive number of treatment options, and is a complaint that carries with it a high risk of complications and morbidity.  I considered the following differential and admission for this acute, potentially life threatening condition.  The differential diagnosis includes knee strain,  contusion, fracture  MDM:    This is a 52 year old female who presents following a mechanical fall.  Reports left leg and knee pain.  She is nontoxic.  Vital signs are largely reassuring.  She has a notable effusion with tenderness and limited range of motion of the left knee.  She also has some contusion of the left tib-fib.  Patient was given pain medication.  Leg was iced and elevated.  X-rays concerning for a minimally displaced tibial plateau fracture.  I independently reviewed this.  CT scan has been ordered.  I did discuss with Dr. Amedeo Kinsman, orthopedic surgery.  Recommends follow-up this week in office.  Will place in a knee immobilizer.  Advised the patient that she will need to be nonweightbearing.  She should elevate and ice the extremity.  She will be discharged with a short course of pain medication.  CT scan was obtained for orthopedic purposes to better characterize the fracture.  (Labs, imaging, consults)  Labs: I Ordered, and personally interpreted labs.  The pertinent results include: N/A  Imaging Studies ordered: I ordered imaging studies including  x-ray, CT left knee I independently visualized and interpreted imaging. I agree with the radiologist interpretation  Additional history obtained from family at bedside.  External records from outside source obtained and reviewed including prior evaluations  Cardiac Monitoring: The patient was not maintained on a cardiac monitor.  If on the cardiac monitor, I personally viewed and interpreted the cardiac monitored which showed an underlying rhythm of: N/A  Reevaluation: After the interventions noted above, I reevaluated the patient and found that they have :improved  Social Determinants of Health:  lives independently  Disposition: Discharge  Co morbidities that complicate the patient evaluation  Past Medical History:  Diagnosis Date   Cancer ([place]) 09/2014   cervix   Depression    Diabetes mellitus without complication ([place])    History of hiatal hernia    Hypertension    Migraines    Neuropathy ([place])    PTSD (post-traumatic stress disorder)      Medicines Meds ordered this encounter  Medications   HYDROcodone-acetaminophen (NORCO/VICODIN) 5-325 MG per tablet 1 tablet   HYDROcodone-acetaminophen (NORCO/VICODIN) 5-325 MG per tablet 1 tablet   oxyCODONE-acetaminophen (PERCOCET/ROXICET) 5-325 MG tablet    Sig: Take 1 tablet by mouth every 6 (six) hours as needed for severe pain.    Dispense:  15 tablet    Refill:  0    I have reviewed the patients home medicines and have made adjustments as needed  Problem List / ED Course: Problem List Items Addressed This Visit   None Visit Diagnoses     Closed fracture of left tibial plateau, initial encounter    -  Primary                   Final Clinical Impression(s) / ED Diagnoses Final diagnoses:  Closed fracture of left tibial plateau, initial encounter    Rx / DC Orders ED Discharge Orders          Ordered    oxyCODONE-acetaminophen (PERCOCET/ROXICET) 5-325 MG tablet  Every 6 hours PRN        09/27/22  0220              Merryl Hacker, MD 09/27/22 0225

## 2022-09-27 NOTE — Discharge Instructions (Addendum)
You were seen today for knee pain.  You have a tibial plateau fracture.  Keep iced and elevated.  Keep knee immobilizer in place.  Do not weightbear and follow-up with orthopedics this week.  Call office on Monday for an appointment.

## 2022-10-08 ENCOUNTER — Ambulatory Visit (INDEPENDENT_AMBULATORY_CARE_PROVIDER_SITE_OTHER): Payer: Self-pay | Admitting: Orthopedic Surgery

## 2022-10-08 ENCOUNTER — Encounter: Payer: Self-pay | Admitting: Orthopedic Surgery

## 2022-10-08 VITALS — BP 133/90 | HR 96

## 2022-10-08 DIAGNOSIS — S82142A Displaced bicondylar fracture of left tibia, initial encounter for closed fracture: Secondary | ICD-10-CM

## 2022-10-08 DIAGNOSIS — M25562 Pain in left knee: Secondary | ICD-10-CM

## 2022-10-08 MED ORDER — HYDROCODONE-ACETAMINOPHEN 10-325 MG PO TABS: 1.0000 | ORAL_TABLET | Freq: Four times a day (QID) | ORAL | 0 refills | Status: AC | PRN
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
## 2022-10-28 DIAGNOSIS — S82142A Displaced bicondylar fracture of left tibia, initial encounter for closed fracture: Secondary | ICD-10-CM | POA: Insufficient documentation

## 2022-10-29 ENCOUNTER — Encounter: Payer: PRIVATE HEALTH INSURANCE | Admitting: Orthopedic Surgery

## 2022-10-29 DIAGNOSIS — S82142D Displaced bicondylar fracture of left tibia, subsequent encounter for closed fracture with routine healing: Secondary | ICD-10-CM

## 2024-07-07 ENCOUNTER — Emergency Department (HOSPITAL_COMMUNITY): Payer: Self-pay

## 2024-07-07 ENCOUNTER — Other Ambulatory Visit: Payer: Self-pay

## 2024-07-07 ENCOUNTER — Emergency Department (HOSPITAL_COMMUNITY)
Admission: EM | Admit: 2024-07-07 | Discharge: 2024-07-08 | Disposition: A | Discharge status: Home / Self Care | Payer: Self-pay | Attending: Emergency Medicine | Admitting: Emergency Medicine

## 2024-07-07 ENCOUNTER — Encounter (HOSPITAL_COMMUNITY): Payer: Self-pay | Admitting: Emergency Medicine

## 2024-07-07 DIAGNOSIS — S62326A Displaced fracture of shaft of fifth metacarpal bone, right hand, initial encounter for closed fracture: Secondary | ICD-10-CM | POA: Insufficient documentation

## 2024-07-07 DIAGNOSIS — W010XXA Fall on same level from slipping, tripping and stumbling without subsequent striking against object, initial encounter: Secondary | ICD-10-CM | POA: Insufficient documentation

## 2024-07-07 NOTE — ED Triage Notes (Addendum)
 Pt bib EMS after tripping over her dog, falling and injuring R wrist. Denies LOC. Swelling noted to R wrist/hand areas.

## 2024-07-08 MED ORDER — OXYCODONE-ACETAMINOPHEN 5-325 MG PO TABS: 1.0000 | ORAL_TABLET | Freq: Four times a day (QID) | ORAL | 0 refills | Status: AC | PRN

## 2024-07-08 MED ORDER — OXYCODONE-ACETAMINOPHEN 5-325 MG PO TABS
2.0000 | ORAL_TABLET | Freq: Once | ORAL | Status: AC
  Administered 2024-07-08: 2 via ORAL
  Filled 2024-07-08: qty 2

## 2024-07-08 NOTE — ED Provider Notes (Signed)
 " [place] EMERGENCY DEPARTMENT AT [facility] Provider Note   CSN: [id number] Arrival date & time: 07/07/24  2319     Patient presents with: Jasmine Morrison is a 53 y.o. female.   53 year old female with a mechanical fall and put out her right hand to try to stop her self and started having significant right medial hand pain.  Also with swelling.  Presents here for further evaluation.  Did not hit her head or hurt anything else.   Fall       Prior to Admission medications  Medication Sig Start Date End Date Taking? Authorizing Provider  oxyCODONE -acetaminophen  (PERCOCET/ROXICET) 5-325 MG tablet Take 1 tablet by mouth every 6 (six) hours as needed. 07/08/24  Yes Zailyn Thoennes, Selinda, MD  HYDROcodone -acetaminophen  (NORCO) 10-325 MG tablet Take 1 tablet by mouth every 6 (six) hours as needed. 10/08/22   Margrette Taft BRAVO, MD  Multiple Vitamin (MULTIVITAMIN) tablet Take 1 tablet by mouth daily.    [provider]  Multiple Vitamins-Minerals (ECHINACEA ACZ PO) Take 1 tablet by mouth daily.    [provider]  Omega-3 Fatty Acids (FISH OIL PO) Take 1 capsule by mouth daily.    [provider]  VITAMIN E PO Take 1 tablet by mouth daily.    [provider]    Allergies: Buspar [buspirone]    Review of Systems  Updated Vital Signs BP 113/70 (BP Location: Left Arm)   Pulse 92   Temp 97.8 F (36.6 C) (Oral)   Resp 16   Ht 5' 4 (1.626 m)   Wt 90.7 kg   LMP 01/21/2013   SpO2 95%   BMI 34.33 kg/m   Physical Exam Vitals and nursing note reviewed.  Constitutional:      Appearance: She is well-developed.  HENT:     Head: Normocephalic and atraumatic.  Cardiovascular:     Rate and Rhythm: Normal rate and regular rhythm.  Pulmonary:     Effort: No respiratory distress.     Breath sounds: No stridor.  Abdominal:     General: There is no distension.  Musculoskeletal:     Cervical back: Normal range of motion.     Comments:  Edema and ecchymosis to right medial dorsal hand with tenderness in same area.  Neurovascularly intact distally.  Neurological:     Mental Status: She is alert.     (all labs ordered are listed, but only abnormal results are displayed) Labs Reviewed - No data to display  EKG: None  Radiology: DG Hand Complete Right Result Date: 07/08/2024 EXAM: 3 OR MORE VIEW(S) XRAY OF THE HAND 07/07/2024 11:56:11 PM COMPARISON: None available. CLINICAL HISTORY: fall with pain and swelling FINDINGS: BONES AND JOINTS: Fracture of the distal fifth shaft, with a fragment located medially. 4 mm of overriding of fracture fragments. Moderate volar angulation of the distal fracture fragment. Nondisplaced fracture of the base of the fourth metacarpal. SOFT TISSUES: Soft tissue swelling. IMPRESSION: 1. Fracture of the distal fifth metacarpal shaft with 4 mm of overriding and moderate volar angulation of the distal fracture fragment. 2. Nondisplaced fracture of the base of the fourth metacarpal. 3. Soft tissue swelling. Electronically signed by: Dorethia Molt MD 07/08/2024 12:22 AM EST RP Workstation: [id number]   DG Wrist Complete Right Result Date: 07/08/2024 EXAM: 3 or more VIEW(S) XRAY OF THE RIGHT WRIST 07/07/2024 11:56:11 PM COMPARISON: None available. CLINICAL HISTORY: Fall with pain and swelling FINDINGS: BONES AND JOINTS: Displaced oblique fracture of  distal right fifth metacarpal diaphysis, with volar displacement, override and angulation at the fracture site. Nondisplaced fracture at the base of the fourth metacarpal. SOFT TISSUES: Diffuse soft tissue edema. IMPRESSION: 1. Displaced oblique fracture of the distal right fifth metacarpal diaphysis with volar displacement, override, and angulation at the fracture site. 2. Nondisplaced fracture at the base of the fourth metacarpal. 3. Diffuse soft tissue edema. Electronically signed by: Dorethia Molt MD 07/08/2024 12:21 AM EST RP Workstation: [id number]      Procedures   Medications Ordered in the ED  oxyCODONE -acetaminophen  (PERCOCET/ROXICET) 5-325 MG per tablet 2 tablet (2 tablets Oral Given 07/08/24 0254)                                    Medical Decision Making Amount and/or Complexity of Data Reviewed Radiology: ordered.  Risk Prescription drug management.   Patient with fifth metacarpal fracture.  Splinted with ulnar gutter splint.  Pain medication given here and prescribed at home.  She is seeing Dr. Margrette in the past we will follow-up with him for further management in about a week.  No other injuries to need further evaluation mechanical fall without syncope so no indication for medical workup.     Final diagnoses:  Closed displaced fracture of shaft of fifth metacarpal bone of right hand, initial encounter    ED Discharge Orders          Ordered    oxyCODONE -acetaminophen  (PERCOCET/ROXICET) 5-325 MG tablet  Every 6 hours PRN        07/08/24 0228    AMB referral to orthopedics       Comments: established   07/08/24 0229               Khia Dieterich, Selinda, MD 07/08/24 0543  "
